# Patient Record
Sex: Female | Born: 1940
Health system: Southern US, Community
[De-identification: ages and names within clinical notes are randomized; demographics above are authoritative.]

## PROBLEM LIST (undated history)

## (undated) DIAGNOSIS — I10 Essential (primary) hypertension: Secondary | ICD-10-CM

## (undated) DIAGNOSIS — M1990 Unspecified osteoarthritis, unspecified site: Secondary | ICD-10-CM

## (undated) DIAGNOSIS — J449 Chronic obstructive pulmonary disease, unspecified: Secondary | ICD-10-CM

## (undated) DIAGNOSIS — Z8673 Personal history of transient ischemic attack (TIA), and cerebral infarction without residual deficits: Secondary | ICD-10-CM

## (undated) DIAGNOSIS — F17200 Nicotine dependence, unspecified, uncomplicated: Secondary | ICD-10-CM

## (undated) DIAGNOSIS — I429 Cardiomyopathy, unspecified: Secondary | ICD-10-CM

## (undated) DIAGNOSIS — I639 Cerebral infarction, unspecified: Secondary | ICD-10-CM

## (undated) HISTORY — PX: ABDOMINAL HYSTERECTOMY: SHX81

## (undated) HISTORY — DX: Personal history of transient ischemic attack (TIA), and cerebral infarction without residual deficits: Z86.73

## (undated) HISTORY — DX: Essential (primary) hypertension: I10

## (undated) HISTORY — PX: KNEE SURGERY: SHX244

## (undated) HISTORY — DX: Cardiomyopathy, unspecified: I42.9

## (undated) HISTORY — DX: Nicotine dependence, unspecified, uncomplicated: F17.200

## (undated) HISTORY — PX: LAMINECTOMY: SHX219

## (undated) HISTORY — PX: TONSILLECTOMY: SUR1361

## (undated) HISTORY — PX: CHOLECYSTECTOMY: SHX55

## (undated) HISTORY — DX: Chronic obstructive pulmonary disease, unspecified: J44.9

---

## 2007-03-28 ENCOUNTER — Inpatient Hospital Stay (HOSPITAL_COMMUNITY): Admission: EM | Admit: 2007-03-28 | Discharge: 2007-03-30 | Payer: Self-pay | Admitting: Emergency Medicine

## 2007-04-16 ENCOUNTER — Ambulatory Visit: Payer: Self-pay | Admitting: Cardiology

## 2007-08-27 ENCOUNTER — Encounter: Admission: RE | Admit: 2007-08-27 | Discharge: 2007-08-27 | Payer: Self-pay | Admitting: Occupational Medicine

## 2007-10-20 ENCOUNTER — Ambulatory Visit: Payer: Self-pay | Admitting: Family Medicine

## 2007-10-30 ENCOUNTER — Ambulatory Visit: Payer: Self-pay | Admitting: Family Medicine

## 2007-11-24 ENCOUNTER — Ambulatory Visit: Payer: Self-pay | Admitting: Family Medicine

## 2007-11-25 ENCOUNTER — Encounter: Admission: RE | Admit: 2007-11-25 | Discharge: 2007-11-25 | Payer: Self-pay | Admitting: Family Medicine

## 2007-12-25 ENCOUNTER — Ambulatory Visit: Payer: Self-pay | Admitting: Family Medicine

## 2008-03-24 ENCOUNTER — Ambulatory Visit: Payer: Self-pay | Admitting: Family Medicine

## 2008-05-14 ENCOUNTER — Ambulatory Visit: Payer: Self-pay | Admitting: Family Medicine

## 2009-09-09 ENCOUNTER — Ambulatory Visit: Payer: Self-pay | Admitting: Family Medicine

## 2009-10-17 ENCOUNTER — Ambulatory Visit: Payer: Self-pay | Admitting: Family Medicine

## 2010-01-11 ENCOUNTER — Ambulatory Visit: Payer: Self-pay | Admitting: Family Medicine

## 2010-05-10 ENCOUNTER — Ambulatory Visit: Payer: Self-pay | Admitting: Family Medicine

## 2010-05-18 ENCOUNTER — Emergency Department (HOSPITAL_COMMUNITY): Admission: EM | Admit: 2010-05-18 | Discharge: 2010-05-18 | Payer: Self-pay | Admitting: Family Medicine

## 2010-06-07 ENCOUNTER — Ambulatory Visit: Payer: Self-pay | Admitting: Family Medicine

## 2010-06-08 ENCOUNTER — Ambulatory Visit: Payer: Self-pay | Admitting: Family Medicine

## 2010-07-27 ENCOUNTER — Ambulatory Visit: Payer: Self-pay | Admitting: Family Medicine

## 2010-08-15 ENCOUNTER — Ambulatory Visit: Payer: Self-pay | Admitting: Family Medicine

## 2010-08-30 ENCOUNTER — Ambulatory Visit: Payer: Self-pay | Admitting: Family Medicine

## 2010-09-06 ENCOUNTER — Ambulatory Visit: Payer: Self-pay | Admitting: Family Medicine

## 2010-10-03 ENCOUNTER — Ambulatory Visit: Payer: Self-pay | Admitting: Family Medicine

## 2010-10-06 ENCOUNTER — Encounter: Admission: RE | Admit: 2010-10-06 | Discharge: 2010-10-06 | Payer: Self-pay | Admitting: Family Medicine

## 2010-10-23 ENCOUNTER — Ambulatory Visit: Payer: Self-pay | Admitting: Family Medicine

## 2010-12-11 ENCOUNTER — Ambulatory Visit
Admission: RE | Admit: 2010-12-11 | Discharge: 2010-12-11 | Payer: Self-pay | Source: Home / Self Care | Attending: Family Medicine | Admitting: Family Medicine

## 2011-01-05 ENCOUNTER — Ambulatory Visit (INDEPENDENT_AMBULATORY_CARE_PROVIDER_SITE_OTHER): Payer: 59 | Admitting: Family Medicine

## 2011-01-05 ENCOUNTER — Ambulatory Visit: Payer: Self-pay | Admitting: Family Medicine

## 2011-01-05 DIAGNOSIS — M719 Bursopathy, unspecified: Secondary | ICD-10-CM

## 2011-02-04 LAB — SYNOVIAL CELL COUNT + DIFF, W/ CRYSTALS
Crystals, Fluid: NONE SEEN
Lymphocytes-Synovial Fld: 1 % (ref 0–20)
Neutrophil, Synovial: 85 % — ABNORMAL HIGH (ref 0–25)

## 2011-02-04 LAB — BODY FLUID CULTURE: Culture: NO GROWTH
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
## 2011-05-18 ENCOUNTER — Telehealth: Payer: Self-pay | Admitting: *Deleted

## 2011-05-18 ENCOUNTER — Ambulatory Visit (INDEPENDENT_AMBULATORY_CARE_PROVIDER_SITE_OTHER): Payer: 59 | Admitting: Medical

## 2011-05-18 ENCOUNTER — Encounter: Payer: Self-pay | Admitting: Medical

## 2011-05-18 ENCOUNTER — Ambulatory Visit
Admission: RE | Admit: 2011-05-18 | Discharge: 2011-05-18 | Disposition: A | Discharge status: Home / Self Care | Payer: 59 | Source: Ambulatory Visit | Attending: Medical | Admitting: Medical

## 2011-05-18 VITALS — BP 170/80 | HR 88 | Temp 98.4°F | Ht 66.5 in | Wt 155.0 lb

## 2011-05-18 DIAGNOSIS — M25559 Pain in unspecified hip: Secondary | ICD-10-CM

## 2011-05-18 DIAGNOSIS — M25551 Pain in right hip: Secondary | ICD-10-CM

## 2011-05-18 DIAGNOSIS — I10 Essential (primary) hypertension: Secondary | ICD-10-CM

## 2011-05-18 NOTE — Patient Instructions (Signed)
You can use ice or heat alternatively once to twice daily.  Use 2 Aleve OTC once to twice daily for pain.  Rest.  Go for xray today.

## 2011-05-18 NOTE — Telephone Encounter (Addendum)
Message copied by Dorthula Perfect on Fri May 18, 2011  4:03 PM ------      Message from: Aleen Campi, Kermit Balo      Created: Fri May 18, 2011  2:03 PM       Xray of hip shows some arthritis of the inside lower part of the hip joint.  Otherwise normal.  Lets have her take 1-2 Aleve once or twice daily, rest the hip by not being on it as much the next 3-5 days, and can use ice when flared up.   As we mentioned, I think it would be good to do some pool therapy or other PT. See if she is interested in seeing physical therapy for this?  If not, I would recommend she go to a local [facility] for water aerobics.  Pt notified of xray results.  Will go to the [facility] for water aerobics.  CM, LPN

## 2011-05-18 NOTE — Progress Notes (Signed)
  Subjective:    Gabriela Davis is a 70 y.o. female who presents with right hip pain. Onset of the symptoms was several weeks ago. Inciting event: none. The patient reports the hip pain is worse with weight bearing, is aggravated by walking, is worse after period of inactivity and is better with activity. Patient has had no prior hip problems. Previous visits for this problem: none. Evaluation to date: none. Treatment to date: ice and heat only.  She denies trauma or injury.  The following portions of the patient's history were reviewed and updated as appropriate: allergies, current medications, past family history, past medical history, past social history, past surgical history and problem list.   Past Medical History  Diagnosis Date  . Hypertension   . COPD (chronic obstructive pulmonary disease)   . Dyslipidemia   . History of CVA (cerebrovascular accident)   . Tobacco use disorder     Review of Systems Gen: no fever, chills, but +intentional wt loss, no falls Skin: no redness, bruising Abdomen: no pain, N/V/D/change in stool, blood GU: no dysuria, hematuria, urgency, change in stream Neuro: no numbness, tingling, weakness  Objective:   Filed Vitals:   05/18/11 0951  BP: 170/80  Pulse: 88  Temp: 98.4 F (36.9 C)   Gen: wd/wn, white female, and Skin: warm, dry, no ecchymosis or erythema Abdomen: non tender, no masses Back: mild right paraspinal low lumbar tenderness, ROM normal, no midline tenderness, there is a lumbar surgical scar from prior discectomy MSK: tender over right hip, tender over right trochanteric bursa, pain with hip ROM, worse with internal ROM, bilat internal ROM mildly reduced, otherwise knees and LE nontender, normal ROM, no obvious deformity Pulses: 2+ LE, no edema Neuro: normal leg strength and sensation   Assessment:   Encounter Diagnoses  Name Primary?  . Hip pain, right Yes  . Essential hypertension, benign       Plan:   She is tender  over right hip, pain with hip ROM, worse with internal ROM, and tender over bursa.  I suspect arthritis and bursitis.  Will send for xray.  For now, advised ice/heat alternating, 2 Aleve OTC once to BID, relative rest.    HTN - elevated today.  Recheck nurse visit BP check 2wk.

## 2011-05-21 ENCOUNTER — Encounter: Payer: Self-pay | Admitting: Family Medicine

## 2011-05-25 ENCOUNTER — Encounter: Payer: Self-pay | Admitting: Family Medicine

## 2011-05-25 ENCOUNTER — Ambulatory Visit (INDEPENDENT_AMBULATORY_CARE_PROVIDER_SITE_OTHER): Payer: 59 | Admitting: Family Medicine

## 2011-05-25 VITALS — BP 120/84 | HR 80 | Ht 65.5 in | Wt 155.0 lb

## 2011-05-25 DIAGNOSIS — S80869A Insect bite (nonvenomous), unspecified lower leg, initial encounter: Secondary | ICD-10-CM

## 2011-05-25 DIAGNOSIS — W57XXXA Bitten or stung by nonvenomous insect and other nonvenomous arthropods, initial encounter: Secondary | ICD-10-CM

## 2011-05-25 NOTE — Patient Instructions (Addendum)
Treat local reaction with ongoing ice, antihistamines (continue Zyrtec, may use dose of Benadryl at bedtime),  May use topical hydrocortisone or antihistamine to help with the itching. Keep leg elevated (above the level of heart) to help decrease the swelling. Follow up immediately if develops fever, red streaks, significantly worsening swelling, or other concerns  Keep up the good work in cutting back on your smoking

## 2011-05-25 NOTE — Progress Notes (Signed)
Patient presents for evaluation of her right ankle.  She was stung by a yellow jacket yesterday morning.  She thinks she got the stinger out.  Complaining of it being very itchy, and increasing swelling.  Today it is throbbing, and still itchy.  Has iced it, used calamine lotion, and is taking Zyrtec  Past Medical History  Diagnosis Date  . Hypertension   . COPD (chronic obstructive pulmonary disease)   . Dyslipidemia   . History of CVA (cerebrovascular accident)   . Tobacco use disorder    Current Outpatient Prescriptions on File Prior to Visit  Medication Sig Dispense Refill  . olmesartan-hydrochlorothiazide (BENICAR HCT) 20-12.5 MG per tablet Take 1 tablet by mouth daily.         Allergies  Allergen Reactions  . Keflex Anaphylaxis  . Morphine And Related Nausea And Vomiting  . Penicillins Swelling    SEVERE   ROS: no fevers, nausea/vomiting/diarrhea, URI complaints, shortness of breath or other problems  PHYSICAL EXAM: BP 120/84  Pulse 80  Ht 5' 5.5" (1.664 m)  Wt 155 lb (70.308 kg)  BMI 25.40 kg/m2 Well developed, pleasant female, in no distress Medial R calf (lower third) there is a 5.5 x 5.5 cm slightly pink area with a few excoriations/scabs noted (the actual sting area, per pt, is off-center from this circle, admits to scratching and causing some of the other excoriations).  Area feels warm.  No drainage.  No streaks.  1+ edema at ankle. 2+ distal pulses.  ASSESSMENT/PLAN: 1. Insect bite of leg    Insect bite with local reaction.  No evidence of cellulitis.  Treat local reaction with ongoing ice, antihistamines (continue Zyrtec, may use dose of Benadryl at bedtime),  May use topical hydrocortisone or antihistamine to help with the itching. Keep leg elevated (above the level of heart) to help decrease the swelling. Follow up immediately if develops fever, red streaks, significantly worsening swelling, or other concerns   Patient had questions regarding shingles vaccine.   Discussed risks and benefits.  Rx was written and given to patient so she can go to [employer] (or other) vs check with her insurance

## 2011-06-04 ENCOUNTER — Ambulatory Visit (INDEPENDENT_AMBULATORY_CARE_PROVIDER_SITE_OTHER): Payer: 59 | Admitting: Family Medicine

## 2011-06-04 ENCOUNTER — Encounter: Payer: Self-pay | Admitting: Family Medicine

## 2011-06-04 VITALS — BP 154/80 | HR 72 | Ht 65.5 in | Wt 156.0 lb

## 2011-06-04 DIAGNOSIS — S80819A Abrasion, unspecified lower leg, initial encounter: Secondary | ICD-10-CM

## 2011-06-04 DIAGNOSIS — IMO0002 Reserved for concepts with insufficient information to code with codable children: Secondary | ICD-10-CM

## 2011-06-04 DIAGNOSIS — I10 Essential (primary) hypertension: Secondary | ICD-10-CM | POA: Insufficient documentation

## 2011-06-04 NOTE — Patient Instructions (Signed)
Continue to monitor BP at home--if persistently greater than 135/85 then f/u with Dr. Susann Givens.  Low sodium diet, regular exercise  Use topical antibacterial ointment 2-3 times daily until skin is healed.  Follow up immediately if you develop increased redness, warmth, swelling, red streaks up the leg, or fevers
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
## 2011-06-12 ENCOUNTER — Other Ambulatory Visit: Payer: Self-pay | Admitting: Family Medicine

## 2011-07-17 ENCOUNTER — Ambulatory Visit (INDEPENDENT_AMBULATORY_CARE_PROVIDER_SITE_OTHER): Payer: 59 | Admitting: Family Medicine

## 2011-07-17 ENCOUNTER — Encounter: Payer: Self-pay | Admitting: Family Medicine

## 2011-07-17 VITALS — BP 140/88 | HR 88 | Wt 161.0 lb

## 2011-07-17 DIAGNOSIS — L259 Unspecified contact dermatitis, unspecified cause: Secondary | ICD-10-CM

## 2011-07-17 NOTE — Patient Instructions (Signed)
Use cortisone cream and Benadryl. If it gets worse call me

## 2011-07-17 NOTE — Progress Notes (Signed)
  Subjective:    Patient ID: Gabriela Davis, female    DOB: Jul 03, 1941, 70 y.o.   MRN: [id number]  HPI She is here for evaluation of a rash. His present in her underwear line as well as on her arms.   Review of Systems     Objective:   Physical Exam Alert and in no distress. Erythematous area noted in her underwear area as well as scattered lesions on her arms. These or not linear and not vesicular.       Assessment & Plan:  Contact dermatitis Recommend cortisone cream and Benadryl. Call if symptoms worsen .

## 2011-09-11 ENCOUNTER — Ambulatory Visit (INDEPENDENT_AMBULATORY_CARE_PROVIDER_SITE_OTHER): Payer: 59 | Admitting: Medical

## 2011-09-11 ENCOUNTER — Encounter: Payer: Self-pay | Admitting: Medical

## 2011-09-11 VITALS — BP 140/90 | HR 62 | Temp 98.4°F | Resp 16 | Ht 66.0 in | Wt 158.0 lb

## 2011-09-11 DIAGNOSIS — M25539 Pain in unspecified wrist: Secondary | ICD-10-CM

## 2011-09-11 DIAGNOSIS — M25542 Pain in joints of left hand: Secondary | ICD-10-CM

## 2011-09-11 DIAGNOSIS — I10 Essential (primary) hypertension: Secondary | ICD-10-CM

## 2011-09-11 DIAGNOSIS — M25549 Pain in joints of unspecified hand: Secondary | ICD-10-CM

## 2011-09-11 DIAGNOSIS — M1990 Unspecified osteoarthritis, unspecified site: Secondary | ICD-10-CM | POA: Insufficient documentation

## 2011-09-11 DIAGNOSIS — M25532 Pain in left wrist: Secondary | ICD-10-CM

## 2011-09-11 MED ORDER — NAPROXEN SODIUM 220 MG PO TABS: 220.0000 mg | ORAL_TABLET | Freq: Two times a day (BID) | ORAL | Status: DC

## 2011-09-11 NOTE — Progress Notes (Signed)
Subjective:   HPI  Gabriela Davis is a 70 y.o. female who presents for pain in left hand x 1 day.  She notes that yesterday she was fine, but awoke this morning with pain in her left hand around the wrist and base of thumb.  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  Denies similar pain.  Denies hx/o gout.  She does have hx/o osteoarthritis in the fingers.  This morning started getting pain and slight swelling in the left hand.  Ice seemed to worsen.  Movement worsens, but rest helps.  Using nothing else for the pain.  Denies redness, fever.  No other aggravating or relieving factors.  No other c/o.  She forgot to take her BP medication this morning.   The following portions of the patient's history were reviewed and updated as appropriate: allergies, current medications, past family history, past medical history, past social history, past surgical history and problem list.  Past Medical History  Diagnosis Date  . Hypertension   . COPD (chronic obstructive pulmonary disease)   . Dyslipidemia   . History of CVA (cerebrovascular accident)   . Tobacco use disorder    Allergies  Allergen Reactions  . Keflex Anaphylaxis  . Morphine And Related Nausea And Vomiting  . Penicillins Swelling    SEVERE    Current Outpatient Prescriptions on File Prior to Visit  Medication Sig Dispense Refill  . cetirizine (ZYRTEC) 10 MG tablet Take 10 mg by mouth as needed.        Marland Kitchen lisinopril-hydrochlorothiazide (PRINZIDE,ZESTORETIC) 20-12.5 MG per tablet TAKE ONE TABLET BY MOUTH EVERY DAY  30 tablet  prn   Review of Systems Constitutional: -fever, -chills, -sweats, -unexpected -weight change,-fatigue ENT: -runny nose, -ear pain, -sore throat Cardiology:  -chest pain, -palpitations, -edema Respiratory: -cough, -shortness of breath, -wheezing Gastroenterology: -abdominal pain, -nausea,  -vomiting, -diarrhea, -constipation Hematology: -bleeding or bruising problems Musculoskeletal: -arthralgias, -myalgias, -joint swelling, -back pain Ophthalmology: -vision changes Urology: -dysuria, -difficulty urinating, -hematuria, -urinary frequency, -urgency Neurology: -headache, -weakness, -tingling, -numbness     Objective:   Physical Exam  Filed Vitals:   09/11/11 0949  BP: 140/90  Pulse: 62  Temp: 98.4 F (36.9 C)  Resp: 16    General appearance: alert, no distress, WD/WN, white female Skin: no warmth, erythema MSK: tender over left wrist medially, tender over base of 1st and 2nd metacarpals, left wrist tender with wrist extension, less tenderness with eversion and inversion, but wrist flexion nontender, no obvious swelling, no warmth or erythema.  Several fingers at DIPs with bony arthritic changes. Otherwise bilat UE exam unremarkable. Neuro: normal strength and sensation of bilat UE Pulses: 2+ symmetric, upper and lower extremities, normal cap refill   Assessment and Plan :    Encounter Diagnoses  Name Primary?  . Wrist pain, left Yes  . Pain, joint, hand, left   . Osteoarthritis   . Essential hypertension, benign    Wrist and hand pain - advised Aleve OTC for 3-5 days prn, consider either ACE wrap for compression or OTC wrist/thumb splint for the next 3-5 days prn for comfort and immobilization.  Advised relative rest.  As symptoms improve, then use splint prn only.   recheck in 1-2 wk.   Osteoarthritis - hx/o OA in fingers, advised regular exercise, OTC Tylenol or Aleve prn use.    HTN - advised she make sure she  takes her medication daily.    Follow-up  1-2 wk.

## 2011-09-26 ENCOUNTER — Ambulatory Visit (INDEPENDENT_AMBULATORY_CARE_PROVIDER_SITE_OTHER): Payer: 59 | Admitting: Family Medicine

## 2011-09-26 ENCOUNTER — Emergency Department (HOSPITAL_COMMUNITY): Payer: No Typology Code available for payment source

## 2011-09-26 ENCOUNTER — Encounter (HOSPITAL_COMMUNITY): Payer: Self-pay

## 2011-09-26 ENCOUNTER — Encounter: Payer: Self-pay | Admitting: Family Medicine

## 2011-09-26 ENCOUNTER — Emergency Department (HOSPITAL_COMMUNITY)
Admission: EM | Admit: 2011-09-26 | Discharge: 2011-09-26 | Disposition: A | Discharge status: Home / Self Care | Payer: No Typology Code available for payment source | Attending: Emergency Medicine | Admitting: Emergency Medicine

## 2011-09-26 DIAGNOSIS — Z8673 Personal history of transient ischemic attack (TIA), and cerebral infarction without residual deficits: Secondary | ICD-10-CM | POA: Insufficient documentation

## 2011-09-26 DIAGNOSIS — S0083XA Contusion of other part of head, initial encounter: Secondary | ICD-10-CM

## 2011-09-26 DIAGNOSIS — F17200 Nicotine dependence, unspecified, uncomplicated: Secondary | ICD-10-CM | POA: Insufficient documentation

## 2011-09-26 DIAGNOSIS — I10 Essential (primary) hypertension: Secondary | ICD-10-CM | POA: Insufficient documentation

## 2011-09-26 DIAGNOSIS — R42 Dizziness and giddiness: Secondary | ICD-10-CM | POA: Insufficient documentation

## 2011-09-26 DIAGNOSIS — R51 Headache: Secondary | ICD-10-CM

## 2011-09-26 DIAGNOSIS — F29 Unspecified psychosis not due to a substance or known physiological condition: Secondary | ICD-10-CM | POA: Insufficient documentation

## 2011-09-26 DIAGNOSIS — J4489 Other specified chronic obstructive pulmonary disease: Secondary | ICD-10-CM | POA: Insufficient documentation

## 2011-09-26 DIAGNOSIS — R220 Localized swelling, mass and lump, head: Secondary | ICD-10-CM | POA: Insufficient documentation

## 2011-09-26 DIAGNOSIS — Z9889 Other specified postprocedural states: Secondary | ICD-10-CM | POA: Insufficient documentation

## 2011-09-26 DIAGNOSIS — E785 Hyperlipidemia, unspecified: Secondary | ICD-10-CM | POA: Insufficient documentation

## 2011-09-26 DIAGNOSIS — J449 Chronic obstructive pulmonary disease, unspecified: Secondary | ICD-10-CM | POA: Insufficient documentation

## 2011-09-26 DIAGNOSIS — M542 Cervicalgia: Secondary | ICD-10-CM | POA: Insufficient documentation

## 2011-09-26 DIAGNOSIS — S0510XA Contusion of eyeball and orbital tissues, unspecified eye, initial encounter: Secondary | ICD-10-CM | POA: Insufficient documentation

## 2011-09-26 DIAGNOSIS — S0003XA Contusion of scalp, initial encounter: Secondary | ICD-10-CM | POA: Insufficient documentation

## 2011-09-26 DIAGNOSIS — Z79899 Other long term (current) drug therapy: Secondary | ICD-10-CM | POA: Insufficient documentation

## 2011-09-26 NOTE — Patient Instructions (Signed)
Please go to [facility] Emergency room for evaluation.  I'm very concerned about your blood pressure and mental status changes--you will likely need CT scan, x-rays, BP monitoring, and labs  Please continue to cut back on your smoking and QUIT.
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
## 2011-09-27 NOTE — ED Provider Notes (Signed)
Medical screening examination/treatment/procedure(s) were performed by non-physician practitioner and as supervising physician I was immediately available for consultation/collaboration.   Leigh-Ann Aymee Fomby, MD 09/27/11 0721 

## 2011-11-01 ENCOUNTER — Ambulatory Visit (INDEPENDENT_AMBULATORY_CARE_PROVIDER_SITE_OTHER): Payer: 59 | Admitting: Family Medicine

## 2011-11-01 ENCOUNTER — Encounter: Payer: Self-pay | Admitting: Family Medicine

## 2011-11-01 VITALS — BP 160/90 | HR 87 | Wt 155.0 lb

## 2011-11-01 DIAGNOSIS — G44309 Post-traumatic headache, unspecified, not intractable: Secondary | ICD-10-CM

## 2011-11-01 DIAGNOSIS — M79609 Pain in unspecified limb: Secondary | ICD-10-CM

## 2011-11-01 DIAGNOSIS — M79673 Pain in unspecified foot: Secondary | ICD-10-CM

## 2011-11-01 NOTE — Patient Instructions (Signed)
Use heat for 20 minutes 3 times per day. Take Tylenol 2 pills 4 times per day for the next week or 2 and see what that'll do to help
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
## 2011-12-25 DIAGNOSIS — Z029 Encounter for administrative examinations, unspecified: Secondary | ICD-10-CM

## 2012-01-14 ENCOUNTER — Ambulatory Visit (INDEPENDENT_AMBULATORY_CARE_PROVIDER_SITE_OTHER): Payer: Medicare HMO | Admitting: Family Medicine

## 2012-01-14 ENCOUNTER — Encounter: Payer: Self-pay | Admitting: Family Medicine

## 2012-01-14 VITALS — BP 140/90 | HR 91

## 2012-01-14 DIAGNOSIS — H0019 Chalazion unspecified eye, unspecified eyelid: Secondary | ICD-10-CM

## 2012-01-14 DIAGNOSIS — H0013 Chalazion right eye, unspecified eyelid: Secondary | ICD-10-CM

## 2012-01-14 NOTE — Progress Notes (Signed)
  Subjective:    Patient ID: Gabriela Davis, female    DOB: [birth year], 71 y.o.   MRN: [id number]  HPI She complains of a lesion in the right lateral lower eyelid. She has been using warm soaks on it for the last week and has not diminished. She is having no pain, blurred or double vision.   Review of Systems     Objective:   Physical Exam Small cystic lesion approximately 0.5 cm noted in the left lateral lower eyelid.      Assessment & Plan:   1. Chalazion of right eye  Ambulatory referral to Ophthalmology

## 2012-01-14 NOTE — Patient Instructions (Signed)
We will refer you to an ophthalmologist. Return here as needed

## 2012-03-25 ENCOUNTER — Ambulatory Visit (INDEPENDENT_AMBULATORY_CARE_PROVIDER_SITE_OTHER): Payer: Medicare HMO | Admitting: Medical

## 2012-03-25 ENCOUNTER — Encounter: Payer: Self-pay | Admitting: Medical

## 2012-03-25 DIAGNOSIS — S335XXA Sprain of ligaments of lumbar spine, initial encounter: Secondary | ICD-10-CM

## 2012-03-25 MED ORDER — ACETAMINOPHEN-CODEINE #3 300-30 MG PO TABS: 1.0000 | ORAL_TABLET | ORAL | Status: AC | PRN

## 2012-03-25 MED ORDER — CYCLOBENZAPRINE HCL 5 MG PO TABS: 5.0000 mg | ORAL_TABLET | Freq: Three times a day (TID) | ORAL | Status: AC | PRN

## 2012-03-25 NOTE — Patient Instructions (Signed)
You have a back strain  Recommendations:  Use OTC Aleve twice daily for the next 3-5 days  No lifting for 3-5 days, then start with gradual light weight lifting if needed  Rest, and you can use some heat pad on the area  You can use your chair massager  Flexeril can be used at bedtime or up to 3 times daily for spasm but this can cause drowsiness  Tylenol #3 for pain as needed  After 4-5 days if much improved, start doing some gentle stretching and gentle range of motion exercise.

## 2012-03-25 NOTE — Progress Notes (Signed)
Subjective: Here for possible pulled muscle in right hip.  She notes that she moved to a new place of residence last week, been packing up lots of her belonging for the move.  Other people lifted the heavier stuff, but she moved a lot of things herself.  Thinks she pulled a muscle.  Pain is 4 inches above and below hips.  Hurts to sit, lie, and worse with rising from sitting position to standing.   When standing has to wait a minute before she can move.  Last night had to prop leg up on a pillow.  She denies trauma or fall. She has hx/o low back pain and prior back surgery L4-5.  Using Aleve and topical ice and heat.   Past Medical History  Diagnosis Date  . Hypertension   . COPD (chronic obstructive pulmonary disease)   . Dyslipidemia   . History of CVA (cerebrovascular accident)   . Tobacco use disorder    Past Surgical History  Procedure Date  . Laminectomy     L4-5  . Cholecystectomy   . Knee surgery     x 2-3, L knee  . Abdominal hysterectomy     Review of Systems Constitutional: denies fever, chills, sweats, unexpected weight change, anorexia, fatigue Dermatology: rash, bruising, redness Cardiology: denies chest pain, palpitations, edema Respiratory: denies cough, shortness of breath Gastroenterology: denies abdominal pain, nausea, vomiting, diarrhea, constipation, blood in stool, changes in bowel movement, dysphagia Urology: denies dysuria, difficulty urinating, hematuria Neurology: no headache, weakness, tingling, numbness      Objective:    Filed Vitals:   03/25/12 1110  BP: 150/90  Pulse: 92  Temp: 98.1 F (36.7 C)  Resp: 16    General appearance: alert, no distress, WD/WN, female Neck: supple, normal ROM Abdomen: +bs, soft, non tender, non distended, no masses, no hepatomegaly, no splenomegaly, no bruits MSK: hips nontender, normal hip ROM Neuro: normal LE strength, DTRs normal, -SLR Back: tender along bilat paraspinal lumbar muscles, worse on right +spasm,  ROM reduced due to spasm and LBP, no deformity, no midline tenderness Extremities: no edema Pulses: 2+ symmetric     Assessment:    Encounter Diagnosis  Name Primary?  . Low back strain Yes      Plan:    Advised relative rest, no lifting x 1wk, then gradually resume normal activity with light lifting at first.   As pain resolves can use gentle ROM and stretching.  Scripts for Flexeril and Tylenol #3 for pain prn, can also use Aleve BID OTC for the next few days.   Call/return if worse or not improving.

## 2012-04-08 ENCOUNTER — Ambulatory Visit (INDEPENDENT_AMBULATORY_CARE_PROVIDER_SITE_OTHER): Payer: Medicare HMO | Admitting: Medical

## 2012-04-08 ENCOUNTER — Encounter: Payer: Self-pay | Admitting: Medical

## 2012-04-08 VITALS — BP 150/80 | HR 102 | Temp 98.1°F | Resp 16 | Wt 162.0 lb

## 2012-04-08 DIAGNOSIS — H6690 Otitis media, unspecified, unspecified ear: Secondary | ICD-10-CM

## 2012-04-08 DIAGNOSIS — J40 Bronchitis, not specified as acute or chronic: Secondary | ICD-10-CM

## 2012-04-08 DIAGNOSIS — R062 Wheezing: Secondary | ICD-10-CM

## 2012-04-08 MED ORDER — ALBUTEROL SULFATE HFA 108 (90 BASE) MCG/ACT IN AERS: 2.0000 | INHALATION_SPRAY | Freq: Four times a day (QID) | RESPIRATORY_TRACT | Status: DC | PRN

## 2012-04-08 MED ORDER — CLARITHROMYCIN 500 MG PO TABS: 500.0000 mg | ORAL_TABLET | Freq: Two times a day (BID) | ORAL | Status: AC

## 2012-04-08 NOTE — Progress Notes (Signed)
Subjective:  Gabriela Davis is a 71 y.o. female who presents for illness.  Started having pain in back yesterday, but last night awoke with ear pain on the left, sweats, and cough hurting through to her back.  She slept all day Sunday 3 days ago, feeling fatigued, and had chills and sweats.  Feels achy all over, feels short of breath today.  Last time she was like this was few years ago.  She does not use inhalers in general.  Denies fever, sore throat, but cough is productive, has sinus fulness.   She says she has been "told she has COPD" in the past.  Smokes 5-6 cigarettes daily.  Denies sick contacts.  No other aggravating or relieving factors.  No other c/o.  The following portions of the patient's history were reviewed and updated as appropriate: allergies, current medications, past family history, past medical history, past social history, past surgical history and problem list.  Past Medical History  Diagnosis Date  . Hypertension   . COPD (chronic obstructive pulmonary disease)   . Dyslipidemia   . History of CVA (cerebrovascular accident)   . Tobacco use disorder     Objective:   Filed Vitals:   04/08/12 0946  BP: 150/80  Pulse: 102  Temp: 98.1 F (36.7 C)  Resp: 16    General appearance: Alert, WD/WN, no distress, ill appearing                             Skin: warm, no rash, no diaphoresis                           Head: no sinus tenderness                            Eyes: conjunctiva normal, corneas clear, PERRLA                            Ears: right TM retracted and erythema, left TM with erythema, external ear canals normal                          Nose: septum midline, turbinates swollen, with erythema and clear discharge             Mouth/throat: MMM, tongue normal, mild pharyngeal erythema                           Neck: supple, no adenopathy, no thyromegaly, nontender                          Heart: RRR, normal S1, S2, no murmurs                         Lungs:  +bronchial breath sounds, +scattered rhonchi, +expiratory wheezes, no rales                Extremities: no edema, nontender     Assessment and Plan:   Encounter Diagnoses  Name Primary?  . Bronchitis Yes  . Otitis media   . Wheezing     Prescription given today for Biaxin and Albuterol as below.  Discussed diagnosis and treatment of bronchitis and OM.  Suggested symptomatic OTC remedies  for cough and congestion.  Tylenol or Ibuprofen OTC for fever and malaise.  Recommended she stop smoking.  Call/return in 2-3 days if symptoms are worse or not improving.

## 2012-04-08 NOTE — Patient Instructions (Signed)
Consider Mucinex DM or Coricidin HBP OTC for cough/congestion  Drink plenty of water, REST.  Begin inhaler 3 times daily for the next few days, then back off to every 6 hours as needed  Begin Biaxin antibiotic, twice daily for 10 days.  If not improving in the next 3-4 days, call or return, particularly if having difficulty breathing   Acute Bronchitis Bronchitis is a problem of the air tubes leading to your lungs. Acute means the illness started quickly. In this condition, the lining of those tubes becomes puffy (swollen) and can leak fluid. This makes it harder for air to get in and out of your lungs. You may cough a lot. This is because the air tubes are narrow. Bronchitis is most often caused by a virus. Medicines that kill germs (antibiotics) may be needed with germ (bacteria) infections for people who:  Smoke.   Have lasting (chronic) lung problems.   Are elderly.  HOME CARE  Rest.   Drink enough water and fluids to keep the pee clear or pale yellow.   Only take medicine as told by your doctor.   Medicines may be prescribed that will open up the airways. This will help make breathing easier.   Bronchitis usually gets better on its own in a few days.  Recovery from some problems (symptoms) of bronchitis may be slow. You should start feeling a little better after 2 to 3 days. Coughing may last for 3 to 4 weeks. GET HELP RIGHT AWAY IF:  You or your child has a temperature by mouth above 101, not controlled by medicine.   Chills or chest pain develops.   You or your child develops very bad shortness of breath.   There is bloody saliva mixed with mucus (sputum).   You or your child throws up (vomits) often, loses too much fluid (dehydration), feels faint, or has a very bad headache.   You or your child does not improve after 1 week of treatment.  MAKE SURE YOU:   Understand these instructions.   Will watch this condition.   Will get help right away if you or your  child is not doing well or gets worse.  Document Released: 04/23/2008 Document Re-Released: 01/30/2010 [facility] Patient Information 2011 [place], [place].   Otitis Media, Adult A middle ear infection is an infection in the space behind the eardrum. The medical name for this is "otitis media." It may happen after a common cold. It is caused by a germ that starts growing in that space. You may feel swollen glands in your neck on the side of the ear infection. HOME CARE INSTRUCTIONS   Take your medicine as directed until it is gone, even if you feel better after the first few days.   Only take over-the-counter or prescription medicines for pain, discomfort, or fever as directed by your caregiver.   Occasional use of a nasal decongestant a couple times per day may help with discomfort and help the eustachian tube to drain better.  Follow up with your caregiver in 10 to 14 days or as directed, to be certain that the infection has cleared. Not keeping the appointment could result in a chronic or permanent injury, pain, hearing loss and disability. If there is any problem keeping the appointment, you must call back to this facility for assistance. SEEK IMMEDIATE MEDICAL CARE IF:   You are not getting better in 2 to 3 days.   You have pain that is not controlled with medication.  You feel worse instead of better.   You cannot use the medication as directed.   You develop swelling, redness or pain around the ear or stiffness in your neck.  MAKE SURE YOU:   Understand these instructions.   Will watch your condition.   Will get help right away if you are not doing well or get worse.  Document Released: 08/10/2004 Document Revised: 10/25/2011 Document Reviewed: 06/11/2008 [facility] Patient Information 2012 [place], [place].

## 2012-04-15 ENCOUNTER — Telehealth: Payer: Self-pay | Admitting: Family Medicine

## 2012-04-15 MED ORDER — LEVOFLOXACIN 500 MG PO TABS: 500.0000 mg | ORAL_TABLET | Freq: Every day | ORAL | Status: DC

## 2012-04-15 NOTE — Telephone Encounter (Signed)
Apparently no improvement on Biaxin. I will switch to Levaquin

## 2012-04-18 ENCOUNTER — Encounter: Payer: Self-pay | Admitting: Family Medicine

## 2012-04-18 ENCOUNTER — Ambulatory Visit (INDEPENDENT_AMBULATORY_CARE_PROVIDER_SITE_OTHER): Payer: Medicare HMO | Admitting: Family Medicine

## 2012-04-18 VITALS — BP 160/100 | HR 114 | Wt 165.0 lb

## 2012-04-18 DIAGNOSIS — H6690 Otitis media, unspecified, unspecified ear: Secondary | ICD-10-CM

## 2012-04-18 DIAGNOSIS — H6693 Otitis media, unspecified, bilateral: Secondary | ICD-10-CM

## 2012-04-18 NOTE — Progress Notes (Signed)
  Subjective:    Patient ID: Gabriela Davis, female    DOB: 04/01/1941, 71 y.o.   MRN: [id number]  HPI She is here for a recheck. She is doing better except for difficulty especially with her left ear and having a popping sensation. She does not complain sore throat, cough, congestion. His only she is on Levaquin.  Review of Systems     Objective:   Physical Exam Alert and in no distress. Both TMs are dull and vascular. Neck is supple without adenopathy. Throat is clear.      Assessment & Plan:   1. BOM (bilateral otitis media)    encouraged her to continue on the antibiotic and did recommend several day course of Afrin to see if this will help with the ear sensation.

## 2012-04-18 NOTE — Patient Instructions (Signed)
Use Afrin for the next few days

## 2012-04-22 ENCOUNTER — Encounter: Payer: Self-pay | Admitting: Medical

## 2012-04-22 ENCOUNTER — Ambulatory Visit (INDEPENDENT_AMBULATORY_CARE_PROVIDER_SITE_OTHER): Payer: Medicare HMO | Admitting: Medical

## 2012-04-22 VITALS — BP 140/90 | HR 94 | Temp 98.2°F | Resp 16 | Wt 164.0 lb

## 2012-04-22 DIAGNOSIS — M25512 Pain in left shoulder: Secondary | ICD-10-CM

## 2012-04-22 DIAGNOSIS — M25519 Pain in unspecified shoulder: Secondary | ICD-10-CM

## 2012-04-22 NOTE — Progress Notes (Addendum)
Subjective: Here for left shoulder pain.  She notes hx/o 2-3 years of left shoulder pain.  She notes hx/o right rotator cuff problems as well, but this time her left shoulder is hurting bad.  The left shoulder flares up every now and then.  She notes point tenderness over the joint.  Worse with pain over the head or to the side.  No other aggravating factors.  Pain can be 7-10/10.  Using Ibuprofen, 2 tablets in the morning, 2 in the evening.  She denies injury or trauma, no joint swelling.  She notes hx/o joint injections in both shoulders prior, wants another injection today. She is right handed.  Past Medical History  Diagnosis Date  . Hypertension   . COPD (chronic obstructive pulmonary disease)   . Dyslipidemia   . History of CVA (cerebrovascular accident)   . Tobacco use disorder    Objective: Gen: wd, wn, nad Skin: no erythema or ecchymosis MSK: point tenderness over left AC joint.  Pain with active ROM with shoulder flexion and abduction above 90 degrees.  Passive ROM relative normal.  No obvious laxity, negative special tests for neers, hawkins, sulcus, apprehension, empty can.  Rest of left UE exam unremarkable. Neuro: normal strength and sensation of UE bilat   Assessment: Encounter Diagnoses  Name Primary?  . AC joint pain, left Yes  . Shoulder pain, left     Plan: Discussed case with supervising physician who also examined patient.  We will send for left shoulder xray for likely arthropathy of left AC joint.   In the meantime can use Ibuprofen, 4 tablets OTC TID. Will likely need ortho referral. If needed can tolerate Tylenol #3 but nothing stronger.

## 2012-04-22 NOTE — Progress Notes (Signed)
Addended by: Jac Canavan on: 04/22/2012 02:51 PM   Modules accepted: Orders

## 2012-04-25 ENCOUNTER — Telehealth: Payer: Self-pay | Admitting: Medical

## 2012-04-25 ENCOUNTER — Other Ambulatory Visit: Payer: Self-pay

## 2012-04-25 DIAGNOSIS — M25519 Pain in unspecified shoulder: Secondary | ICD-10-CM

## 2012-04-25 MED ORDER — ACETAMINOPHEN-CODEINE #3 300-30 MG PO TABS: 1.0000 | ORAL_TABLET | ORAL | Status: DC | PRN

## 2012-04-25 NOTE — Telephone Encounter (Signed)
Pt called ibuprofen 4 X's a day (taking 2, 250mg  tablets) not helping with shoulder pain. Pt states she was told to call back for pain Rx if it was not helping. Pt states Tylenol # 3 w/ codeine is about the strongest pain medication she can take

## 2012-04-25 NOTE — Telephone Encounter (Signed)
Give her Tylenol #3. 30tabs. One or 2 every 4 hours when necessary pain. Make sure she is set up for an x-ray since I did not see the report.

## 2012-04-26 ENCOUNTER — Encounter (HOSPITAL_COMMUNITY): Payer: Self-pay

## 2012-04-26 ENCOUNTER — Emergency Department (INDEPENDENT_AMBULATORY_CARE_PROVIDER_SITE_OTHER)
Admission: EM | Admit: 2012-04-26 | Discharge: 2012-04-26 | Disposition: A | Discharge status: Home / Self Care | Payer: Medicare HMO | Source: Home / Self Care | Attending: Emergency Medicine | Admitting: Emergency Medicine

## 2012-04-26 DIAGNOSIS — M659 Synovitis and tenosynovitis, unspecified: Secondary | ICD-10-CM

## 2012-04-26 DIAGNOSIS — M7750 Other enthesopathy of unspecified foot: Secondary | ICD-10-CM

## 2012-04-26 NOTE — Discharge Instructions (Signed)
Followup with your primary care Dr. as discussed to consider ordering a vascular study. Use provided crutches and Cam Walker for 5-7 days (no longer) if pain persists in the same area in the back of your ankle need to see the orthopedic Dr. further treatment modalities your exam was consistent with Achilles tendinitis

## 2012-04-26 NOTE — ED Provider Notes (Signed)
History     CSN: [id number]  Arrival date & time 04/26/12  1104   First MD Initiated Contact with Patient 04/26/12 1123      Chief Complaint  Patient presents with  . Foot Pain    (Consider location/radiation/quality/duration/timing/severity/associated sxs/prior treatment) HPI Comments: Patient presents urgent care today with ongoing left ankle foot swelling painful it started Thursday. Patient denies any injuries or recent sprains are portions to her left ankle. She describes that her swelling has improved this morning that he was moderate yesterday as her swelling went up to her mid calf area. She denies any calf tenderness or pain when she walks exclusively in the posterior aspect of her left ankle. (See illustration). Patient denies any recent injury, after reviewing her recent telephone notes with her primary care Dr. patient described that she did not mention this to 2 them over the phone and that she has been taking Tylenol #3 for her shoulder problems. Patient is accompanied by her significant other as they describe the problem somewhat differently but he is concerned that she has had a stroke in the past and she continues to smoke and is asking if she is at risk for another stroke or heart problems, which I replied that yes she is at risk.  Patient denies any numbness, tingling sensation her distal weakness.  Patient is a 71 y.o. female presenting with lower extremity pain. The history is provided by the patient and the spouse.  Foot Pain This is a new problem. The current episode started more than 2 days ago. The problem occurs constantly. The problem has been gradually worsening. The symptoms are aggravated by walking and standing. The symptoms are relieved by acetaminophen (Tylenol#3). She has tried acetaminophen for the symptoms. The treatment provided mild relief.    Past Medical History  Diagnosis Date  . Hypertension   . COPD (chronic obstructive pulmonary disease)   .  Dyslipidemia   . History of CVA (cerebrovascular accident)   . Tobacco use disorder     Past Surgical History  Procedure Date  . Laminectomy     L4-5  . Cholecystectomy   . Knee surgery     x 2-3, L knee  . Abdominal hysterectomy     History reviewed. No pertinent family history.  History  Substance Use Topics  . Smoking status: Current Everyday Smoker -- 0.5 packs/day    Types: Cigarettes  . Smokeless tobacco: Never Used   Comment: cutting back, now smoking 6 cigarettes daily  . Alcohol Use: 1.0 oz/week    2 drink(s) per week     2-3 beers every 2 weeks    OB History    Grav Para Term Preterm Abortions TAB SAB Ect Mult Living                  Review of Systems  Constitutional: Negative for activity change and appetite change.  Skin: Positive for color change and pallor.  Neurological: Negative for weakness and numbness.    Allergies  Cephalexin; Morphine and related; and Penicillins  Home Medications   Current Outpatient Rx  Name Route Sig Dispense Refill  . ACETAMINOPHEN-CODEINE #3 300-30 MG PO TABS Oral Take 1 tablet by mouth every 4 (four) hours as needed. 30 tablet 0  . ALBUTEROL SULFATE HFA 108 (90 BASE) MCG/ACT IN AERS Inhalation Inhale 2 puffs into the lungs every 6 (six) hours as needed for wheezing. 1 Inhaler 0  . LISINOPRIL-HYDROCHLOROTHIAZIDE 20-12.5 MG PO TABS  TAKE ONE  TABLET BY MOUTH EVERY DAY 30 tablet prn    BP 158/75  Pulse 82  Temp(Src) 98.5 F (36.9 C) (Oral)  Resp 18  SpO2 95%  Physical Exam  Nursing note and vitals reviewed. Constitutional: She appears well-developed and well-nourished.  Non-toxic appearance. She does not have a sickly appearance. She does not appear ill. No distress.  Musculoskeletal: She exhibits tenderness. She exhibits no edema.       Feet:  Neurological: She is alert.  Skin: No erythema.    ED Course  Procedures (including critical care time)  Labs Reviewed - No data to display No results  found.   1. Tendinitis of ankle       MDM  Exam is suggestive and consistent with aquilles tendinitis without rupture. Have decided to recommend not to bear weight on her left ankle for the next 3-5 days. Have also recommended her to see her primary care Dr. early next next week she might be experiencing early signs of vascular deficiencies as expressed by her delayed capillary refill and distal pallor to both her feet. Patient is a heavy smoker as many risk factors for cardiovascular events and peripheral vascular disease. She understood treatment plan and agreed to followup care we also discuss what symptoms will warrant further evaluation emergently in the emergency department.      Jimmie Molly, MD 04/26/12 1534

## 2012-04-26 NOTE — ED Notes (Signed)
Pt has lt heel pain that started on Thursday with no know injury.  Lt ankle and foot swollen and painful.  The bottom of her foot is purple and when I asked to see bottom of the rt foot it is also purple.  Pt unaware.

## 2012-05-01 ENCOUNTER — Ambulatory Visit (INDEPENDENT_AMBULATORY_CARE_PROVIDER_SITE_OTHER): Payer: Medicare HMO | Admitting: Family Medicine

## 2012-05-01 ENCOUNTER — Encounter: Payer: Self-pay | Admitting: Family Medicine

## 2012-05-01 ENCOUNTER — Other Ambulatory Visit: Payer: Self-pay

## 2012-05-01 VITALS — BP 120/80 | HR 76 | Wt 164.0 lb

## 2012-05-01 DIAGNOSIS — M7662 Achilles tendinitis, left leg: Secondary | ICD-10-CM

## 2012-05-01 DIAGNOSIS — F17200 Nicotine dependence, unspecified, uncomplicated: Secondary | ICD-10-CM

## 2012-05-01 DIAGNOSIS — M7660 Achilles tendinitis, unspecified leg: Secondary | ICD-10-CM

## 2012-05-01 MED ORDER — LISINOPRIL-HYDROCHLOROTHIAZIDE 20-12.5 MG PO TABS: 1.0000 | ORAL_TABLET | Freq: Every day | ORAL | Status: DC

## 2012-05-01 NOTE — Patient Instructions (Signed)
Call [phone] quit now 

## 2012-05-01 NOTE — Progress Notes (Signed)
  Subjective:    Patient ID: Gabriela Davis, female    DOB: [birth year], 71 y.o.   MRN: [id number]  HPI She was recently seen in an urgent care Center and treated for Achilles tendinitis. There is also concerned about her vascular supply to her feet. She has had no foot pain or evidence of activity-related fatigue. She doesn't smoke. She does have a previous history of CVA.   Review of Systems     Objective:   Physical Exam Good motion of the ankle. No tenderness palpation over the insertion of the Achilles tendon or along the tendon. Pulses are normal. Capillary refill is also normal.      Assessment & Plan:   1. Achilles tendinitis of left lower extremity   2. Current smoker    I explained that I did not think she is a danger from her vascular supply. Strongly encouraged her to quit smoking. Recommend she call the 800 number. Since she's not having difficulty with her tendinitis, no further intervention is needed.

## 2012-05-14 ENCOUNTER — Telehealth: Payer: Self-pay | Admitting: Internal Medicine

## 2012-05-14 NOTE — Telephone Encounter (Signed)
LMOM FOR THE PATIENT ABOUT HER LEG AND THE BOOT. CLS

## 2012-05-14 NOTE — Telephone Encounter (Signed)
i would restart the boot x  1wk, ice, rest, stay off the leg, f/u here in 1wk

## 2012-08-15 ENCOUNTER — Ambulatory Visit: Payer: Medicare HMO | Admitting: Medical

## 2012-08-28 ENCOUNTER — Other Ambulatory Visit: Payer: Medicare HMO

## 2012-08-28 DIAGNOSIS — Z23 Encounter for immunization: Secondary | ICD-10-CM

## 2012-08-28 MED ORDER — INFLUENZA VIRUS VACC SPLIT PF IM SUSP: 0.5000 mL | Freq: Once | INTRAMUSCULAR | Status: AC

## 2012-10-21 ENCOUNTER — Telehealth: Payer: Self-pay

## 2012-10-21 ENCOUNTER — Other Ambulatory Visit: Payer: Self-pay

## 2012-10-21 DIAGNOSIS — Z1239 Encounter for other screening for malignant neoplasm of breast: Secondary | ICD-10-CM

## 2012-10-21 NOTE — Telephone Encounter (Signed)
PT CALLED AND STATED SHE WANTED ME TO MAKE HER AN APT FOR MAMMOGRAM SHE HAS NOT HAD ONE SINCE  SHE MOVED TO [place] IS THIS OK WITH YOU PLEASE ADVISE

## 2012-10-21 NOTE — Telephone Encounter (Signed)
Okay 

## 2012-11-20 ENCOUNTER — Ambulatory Visit: Payer: Medicare HMO | Admitting: Family Medicine

## 2012-11-26 ENCOUNTER — Encounter: Payer: Self-pay | Admitting: Family Medicine

## 2012-11-26 ENCOUNTER — Ambulatory Visit (INDEPENDENT_AMBULATORY_CARE_PROVIDER_SITE_OTHER): Payer: Medicare HMO | Admitting: Family Medicine

## 2012-11-26 VITALS — BP 120/80 | HR 96 | Temp 98.4°F

## 2012-11-26 DIAGNOSIS — M1990 Unspecified osteoarthritis, unspecified site: Secondary | ICD-10-CM

## 2012-11-26 DIAGNOSIS — J069 Acute upper respiratory infection, unspecified: Secondary | ICD-10-CM

## 2012-11-26 DIAGNOSIS — J45901 Unspecified asthma with (acute) exacerbation: Secondary | ICD-10-CM

## 2012-11-26 MED ORDER — CLARITHROMYCIN 500 MG PO TABS: 500.0000 mg | ORAL_TABLET | Freq: Two times a day (BID) | ORAL | Status: DC

## 2012-11-26 NOTE — Patient Instructions (Addendum)
Continue on your inhaler. Hopefully this will clear up on its own however if it does not, start on the antibiotic in several days.

## 2012-11-26 NOTE — Progress Notes (Signed)
  Subjective:    Patient ID: Gabriela Davis, female    DOB: 1941/11/14, 72 y.o.   MRN: [id number]  HPI Is a five-day history that started with postnasal drainage followed by a dry cough and sore throat and followed now by a productive cough and some shortness of breath but no fever, chills, earache. She continues to smoke but has cut back. She complains of wrist arthritic type pain. She's concerned or which medications can be used to help with this.   Review of Systems     Objective:   Physical Exam alert and in no distress. Tympanic membranes an and she can get this filled. d canals are normal. His mucosa is slightly erythematous Throat is clear. Tonsils are normal. Neck is supple without adenopathy or thyromegaly. Cardiac exam shows a regular sinus rhythm without murmurs or gallops. Lungs show expiratory wheezing that did clear after nebulizer treatment Slight tenderness to palpation over the ulnar styloid is noted.      Assessment & Plan:   1. URI, acute  clarithromycin (BIAXIN) 500 MG tablet  2. Asthma exacerbation    3. Osteoarthritis     she is to use her albuterol inhaler. If her symptoms do not clear over the next several days, Recommend she start with Tylenol and then add Advil or Aleve. If continued difficulty, recommend she return here for further evaluation.

## 2012-12-08 ENCOUNTER — Inpatient Hospital Stay (HOSPITAL_COMMUNITY)
Admission: EM | Admit: 2012-12-08 | Discharge: 2012-12-09 | DRG: 190 | Disposition: A | Discharge status: Home / Self Care | Payer: Medicare HMO | Attending: Internal Medicine | Admitting: Internal Medicine

## 2012-12-08 ENCOUNTER — Encounter (HOSPITAL_COMMUNITY): Payer: Self-pay | Admitting: Family Medicine

## 2012-12-08 ENCOUNTER — Ambulatory Visit (INDEPENDENT_AMBULATORY_CARE_PROVIDER_SITE_OTHER): Payer: Medicare HMO | Admitting: Medical

## 2012-12-08 ENCOUNTER — Encounter: Payer: Self-pay | Admitting: Medical

## 2012-12-08 ENCOUNTER — Emergency Department (HOSPITAL_COMMUNITY): Payer: Medicare HMO

## 2012-12-08 VITALS — BP 150/80 | HR 97 | Temp 98.2°F | Resp 18 | Wt 185.0 lb

## 2012-12-08 DIAGNOSIS — I10 Essential (primary) hypertension: Secondary | ICD-10-CM | POA: Diagnosis present

## 2012-12-08 DIAGNOSIS — R0602 Shortness of breath: Secondary | ICD-10-CM

## 2012-12-08 DIAGNOSIS — R609 Edema, unspecified: Secondary | ICD-10-CM

## 2012-12-08 DIAGNOSIS — Z66 Do not resuscitate: Secondary | ICD-10-CM | POA: Diagnosis present

## 2012-12-08 DIAGNOSIS — I5033 Acute on chronic diastolic (congestive) heart failure: Secondary | ICD-10-CM | POA: Diagnosis present

## 2012-12-08 DIAGNOSIS — R0902 Hypoxemia: Secondary | ICD-10-CM

## 2012-12-08 DIAGNOSIS — J440 Chronic obstructive pulmonary disease with acute lower respiratory infection: Principal | ICD-10-CM | POA: Diagnosis present

## 2012-12-08 DIAGNOSIS — R05 Cough: Secondary | ICD-10-CM

## 2012-12-08 DIAGNOSIS — Z885 Allergy status to narcotic agent status: Secondary | ICD-10-CM

## 2012-12-08 DIAGNOSIS — Z23 Encounter for immunization: Secondary | ICD-10-CM

## 2012-12-08 DIAGNOSIS — E785 Hyperlipidemia, unspecified: Secondary | ICD-10-CM | POA: Diagnosis present

## 2012-12-08 DIAGNOSIS — R5383 Other fatigue: Secondary | ICD-10-CM

## 2012-12-08 DIAGNOSIS — Z79899 Other long term (current) drug therapy: Secondary | ICD-10-CM

## 2012-12-08 DIAGNOSIS — J40 Bronchitis, not specified as acute or chronic: Secondary | ICD-10-CM

## 2012-12-08 DIAGNOSIS — I509 Heart failure, unspecified: Secondary | ICD-10-CM

## 2012-12-08 DIAGNOSIS — R635 Abnormal weight gain: Secondary | ICD-10-CM

## 2012-12-08 DIAGNOSIS — J209 Acute bronchitis, unspecified: Principal | ICD-10-CM | POA: Diagnosis present

## 2012-12-08 DIAGNOSIS — Z881 Allergy status to other antibiotic agents status: Secondary | ICD-10-CM

## 2012-12-08 DIAGNOSIS — R7989 Other specified abnormal findings of blood chemistry: Secondary | ICD-10-CM | POA: Diagnosis present

## 2012-12-08 DIAGNOSIS — Z8673 Personal history of transient ischemic attack (TIA), and cerebral infarction without residual deficits: Secondary | ICD-10-CM

## 2012-12-08 DIAGNOSIS — Z880 Allergy status to penicillin: Secondary | ICD-10-CM

## 2012-12-08 DIAGNOSIS — R799 Abnormal finding of blood chemistry, unspecified: Secondary | ICD-10-CM

## 2012-12-08 DIAGNOSIS — R0989 Other specified symptoms and signs involving the circulatory and respiratory systems: Secondary | ICD-10-CM

## 2012-12-08 DIAGNOSIS — R600 Localized edema: Secondary | ICD-10-CM | POA: Diagnosis present

## 2012-12-08 DIAGNOSIS — R0600 Dyspnea, unspecified: Secondary | ICD-10-CM

## 2012-12-08 DIAGNOSIS — Z720 Tobacco use: Secondary | ICD-10-CM | POA: Diagnosis present

## 2012-12-08 DIAGNOSIS — F17200 Nicotine dependence, unspecified, uncomplicated: Secondary | ICD-10-CM | POA: Diagnosis present

## 2012-12-08 LAB — BASIC METABOLIC PANEL
BUN: 11 mg/dL (ref 6–23)
Calcium: 9.5 mg/dL (ref 8.4–10.5)
GFR calc Af Amer: >90 mL/min (ref >90)
GFR calc non Af Amer: 86 mL/min — ABNORMAL LOW (ref >90)
Glucose, Bld: 94 mg/dL (ref 70–99)

## 2012-12-08 LAB — CBC
HCT: 42 % (ref 36.0–46.0)
Hemoglobin: 14.3 g/dL (ref 12.0–15.0)
MCH: 33.7 pg (ref 26.0–34.0)
MCH: 34.2 pg — ABNORMAL HIGH (ref 26.0–34.0)
MCHC: 34 g/dL (ref 30.0–36.0)
MCV: 99.1 fL (ref 78.0–100.0)
MCV: 98.5 fL (ref 78.0–100.0)
Platelets: 272 K/uL (ref 150–400)
Platelets: 243 10*3/uL (ref 150–400)
RBC: 4.24 MIL/uL (ref 3.87–5.11)
RDW: 13.2 % (ref 11.5–15.5)
RDW: 13.1 % (ref 11.5–15.5)
WBC: 5.8 K/uL (ref 4.0–10.5)

## 2012-12-08 LAB — POCT I-STAT TROPONIN I: Troponin i, poc: 0.01 ng/mL (ref 0.00–0.08)

## 2012-12-08 LAB — BASIC METABOLIC PANEL WITH GFR
CO2: 30 meq/L (ref 19–32)
Chloride: 95 meq/L — ABNORMAL LOW (ref 96–112)
Creatinine, Ser: 0.69 mg/dL (ref 0.50–1.10)
Potassium: 4.3 meq/L (ref 3.5–5.1)
Sodium: 133 meq/L — ABNORMAL LOW (ref 135–145)

## 2012-12-08 LAB — TROPONIN I: Troponin I: <0.3 ng/mL (ref <0.30)

## 2012-12-08 LAB — D-DIMER, QUANTITATIVE: D-Dimer, Quant: 2.53 ug/mL-FEU — ABNORMAL HIGH (ref 0.00–0.48)

## 2012-12-08 LAB — PRO B NATRIURETIC PEPTIDE: Pro B Natriuretic peptide (BNP): 3490 pg/mL — ABNORMAL HIGH (ref 0–125)

## 2012-12-08 MED ORDER — ALBUTEROL SULFATE (5 MG/ML) 0.5% IN NEBU
2.5000 mg | INHALATION_SOLUTION | RESPIRATORY_TRACT | Status: DC
  Administered 2012-12-08 – 2012-12-09 (×3): 2.5 mg via RESPIRATORY_TRACT
  Filled 2012-12-08: qty 0.5

## 2012-12-08 MED ORDER — METHYLPREDNISOLONE SODIUM SUCC 125 MG IJ SOLR
125.0000 mg | Freq: Once | INTRAMUSCULAR | Status: AC
  Administered 2012-12-08: 125 mg via INTRAVENOUS
  Filled 2012-12-08: qty 2

## 2012-12-08 MED ORDER — PREDNISONE 50 MG PO TABS
50.0000 mg | ORAL_TABLET | Freq: Every day | ORAL | Status: DC
Start: 2012-12-09 — End: 2012-12-09
  Administered 2012-12-09: 50 mg via ORAL
  Filled 2012-12-08 (×2): qty 1

## 2012-12-08 MED ORDER — ALBUTEROL SULFATE (5 MG/ML) 0.5% IN NEBU
5.0000 mg | INHALATION_SOLUTION | Freq: Once | RESPIRATORY_TRACT | Status: AC
  Administered 2012-12-08: 5 mg via RESPIRATORY_TRACT
  Filled 2012-12-08 (×2): qty 1

## 2012-12-08 MED ORDER — ISOSORB DINITRATE-HYDRALAZINE 20-37.5 MG PO TABS
1.0000 | ORAL_TABLET | Freq: Two times a day (BID) | ORAL | Status: DC
  Administered 2012-12-08 – 2012-12-09 (×2): 1 via ORAL
  Filled 2012-12-08 (×3): qty 1

## 2012-12-08 MED ORDER — LEVOFLOXACIN IN D5W 500 MG/100ML IV SOLN
500.0000 mg | INTRAVENOUS | Status: DC
  Administered 2012-12-08: 500 mg via INTRAVENOUS
  Filled 2012-12-08 (×2): qty 100

## 2012-12-08 MED ORDER — ONDANSETRON HCL 4 MG/2ML IJ SOLN: 4.0000 mg | Freq: Four times a day (QID) | INTRAMUSCULAR | Status: DC | PRN

## 2012-12-08 MED ORDER — PANTOPRAZOLE SODIUM 40 MG PO TBEC: 40.0000 mg | DELAYED_RELEASE_TABLET | Freq: Every day | ORAL | Status: DC

## 2012-12-08 MED ORDER — FUROSEMIDE 10 MG/ML IJ SOLN
40.0000 mg | Freq: Once | INTRAMUSCULAR | Status: AC
  Administered 2012-12-08: 40 mg via INTRAVENOUS
  Filled 2012-12-08: qty 4

## 2012-12-08 MED ORDER — ACETAMINOPHEN 325 MG PO TABS: 650.0000 mg | ORAL_TABLET | ORAL | Status: DC | PRN

## 2012-12-08 MED ORDER — NITROGLYCERIN 0.4 MG SL SUBL
0.4000 mg | SUBLINGUAL_TABLET | Freq: Once | SUBLINGUAL | Status: DC
  Filled 2012-12-08: qty 25

## 2012-12-08 MED ORDER — LISINOPRIL 20 MG PO TABS
20.0000 mg | ORAL_TABLET | Freq: Every day | ORAL | Status: DC
  Administered 2012-12-08 – 2012-12-09 (×2): 20 mg via ORAL
  Filled 2012-12-08 (×2): qty 1

## 2012-12-08 MED ORDER — IPRATROPIUM BROMIDE 0.02 % IN SOLN
0.5000 mg | Freq: Once | RESPIRATORY_TRACT | Status: AC
  Administered 2012-12-08: 0.5 mg via RESPIRATORY_TRACT
  Filled 2012-12-08: qty 2.5

## 2012-12-08 MED ORDER — ALBUTEROL SULFATE (5 MG/ML) 0.5% IN NEBU
5.0000 mg | INHALATION_SOLUTION | Freq: Once | RESPIRATORY_TRACT | Status: AC
  Administered 2012-12-08: 5 mg via RESPIRATORY_TRACT
  Filled 2012-12-08: qty 1

## 2012-12-08 MED ORDER — ENOXAPARIN SODIUM 40 MG/0.4ML ~~LOC~~ SOLN
40.0000 mg | SUBCUTANEOUS | Status: DC
  Administered 2012-12-08: 40 mg via SUBCUTANEOUS
  Filled 2012-12-08 (×2): qty 0.4

## 2012-12-08 MED ORDER — FUROSEMIDE 10 MG/ML IJ SOLN
40.0000 mg | Freq: Three times a day (TID) | INTRAMUSCULAR | Status: DC
  Administered 2012-12-08 – 2012-12-09 (×2): 40 mg via INTRAVENOUS
  Filled 2012-12-08 (×5): qty 4

## 2012-12-08 MED ORDER — ALBUTEROL SULFATE (5 MG/ML) 0.5% IN NEBU
2.5000 mg | INHALATION_SOLUTION | Freq: Four times a day (QID) | RESPIRATORY_TRACT | Status: AC
  Filled 2012-12-08 (×2): qty 0.5

## 2012-12-08 MED ORDER — SODIUM CHLORIDE 0.9 % IJ SOLN: 3.0000 mL | INTRAMUSCULAR | Status: DC | PRN

## 2012-12-08 MED ORDER — BUDESONIDE 0.25 MG/2ML IN SUSP
0.2500 mg | Freq: Two times a day (BID) | RESPIRATORY_TRACT | Status: DC
  Administered 2012-12-08 – 2012-12-09 (×2): 0.25 mg via RESPIRATORY_TRACT
  Filled 2012-12-08 (×4): qty 2

## 2012-12-08 MED ORDER — PNEUMOCOCCAL VAC POLYVALENT 25 MCG/0.5ML IJ INJ
0.5000 mL | INJECTION | INTRAMUSCULAR | Status: AC
  Administered 2012-12-09: 0.5 mL via INTRAMUSCULAR
  Filled 2012-12-08: qty 0.5

## 2012-12-08 MED ORDER — SODIUM CHLORIDE 0.9 % IJ SOLN
3.0000 mL | Freq: Two times a day (BID) | INTRAMUSCULAR | Status: DC
  Administered 2012-12-08 – 2012-12-09 (×2): 3 mL via INTRAVENOUS

## 2012-12-08 MED ORDER — IPRATROPIUM BROMIDE 0.02 % IN SOLN
0.5000 mg | RESPIRATORY_TRACT | Status: DC
  Administered 2012-12-08 – 2012-12-09 (×3): 0.5 mg via RESPIRATORY_TRACT
  Filled 2012-12-08 (×3): qty 2.5

## 2012-12-08 MED ORDER — SODIUM CHLORIDE 0.9 % IV SOLN
250.0000 mL | INTRAVENOUS | Status: DC | PRN
  Administered 2012-12-08: 250 mL via INTRAVENOUS

## 2012-12-08 NOTE — Progress Notes (Signed)
Subjective: Here for not feeling well.  Been feeling bad for 2 weeks.  Hurts to take a deep breath.  Legs are swelling, this started about 2 weeks ago.  Fatigued, wore out, can't walk up stairs without getting out of breath.   Denies fever.  She does report a lot of cough, cough is productive greenish color, but later in the day clear.  No red or pink frothy color.  No palpitations of the heart.  Just has no energy.  She is worried about her heart.  Was seen here recently for respiratory infection and is currently taking Biaxin, just started this Thursday but not improving.     Past Medical History  Diagnosis Date  . Hypertension   . COPD (chronic obstructive pulmonary disease)   . Dyslipidemia   . History of CVA (cerebrovascular accident)   . Tobacco use disorder    Review of Systems Constitutional: -fever, -chills, -sweats, +weight change, +fatigue ENT: +runny nose, +sinus pressure, -ear pain, -sore throat Cardiology:   -palpitations Gastroenterology: -abdominal pain, -nausea, -vomiting, -diarrhea, -constipation Musculoskeletal: -arthralgias, -myalgias, -joint swelling, +pain in back Urology: -dysuria, -difficulty urinating,no decreased output, -hematuria, -urinary frequency, -urgency Neurology: -headache, -weakness, -tingling, -numbness  Objective: Gen: wd, wn, nad Skin: warm, dry Heent: sinuses tender, TMs pearly, nares patent, pharynx normal Oral: MMM, left upper molar in decay, no other lesions Neck: supple, nontender, mild JVP increased, no mass, no thyromegaly, no mass Lungs: decreased breath sounds bilat lower fields, faint rales right lower feels, few faint wheezes with expiration Heart:  ectopic beats heard, no murmurs, otherwise fairly regular Abdomen: +bs, soft, nontender, no mass Pulses:  ectopic beats, otherwsie 1+ UE and LE Ext: slight nonpitting edema in bilat ankles/feet   Adult ECG Report  Indication: DOE, SOB  Rate: 85 bpm  Rhythm: sinus rhythm with occasional  premature ventricular complexes and premature atrial complexes  QRS Axis: -16 degrees  PR Interval: 136 ms  QRS Duration: 92ms  QTc:  Conduction Disturbances: PVCs and PACs  Other Abnormalities: Q in III (new)  Patient's cardiac risk factors are: advanced age (older than 74 for men, 47 for women), hypertension, obesity (BMI >= 30 kg/m2), sedentary lifestyle and smoking/ tobacco exposure.  EKG comparison: compared to 01/11/10 new q wave in III, new PVC, new PACs,   Narrative Interpretation: left axis deviation, can't rule out anterior infarct   Assessment: Encounter Diagnoses  Name Primary?  . SOB (shortness of breath) Yes  . DOE (dyspnea on exertion)   . Fatigue   . Cough   . Weight gain     Pulse ox on presentation 91% room air and somewhat dyspneic at times.  Desaturated to 86% walking about 3 minutes around the office.   Desaturated from 96% to 92% just standing in the exam room from the table to chair.  Her last weight here was 164lb in June, but currently is 185lb.  She likely has gained about 10lb in the last 2 wk per her report.  EKG changed from prior.  Abnormal lungs sounds today.    Plan: She has underlying COPD, is an active smoker, does have respiratory complaints, on Biaxin, but in light of her worsening symptoms, CHF seems a likely diagnosis.    Dr. Susann Givens and I both discussed her findings with her.  After getting EKG, discussed findings, symptoms, and possible differential with her including CHF, ischemia, pneumonia and other.   We advised she be transported to the ED by EMS.  She refused  transport.   However, she does agree to report to the ED now by personal vehicle.  We discussed possible risks of not going being evaluated and treated in the ED urgently at this time.  She is aware of this risks and is leaving against our advice but will report to [facility] ED by personal vehicle.  I called the ED triage to advise she will be coming for evaluation.

## 2012-12-08 NOTE — ED Notes (Signed)
Negative for DVT's bilaterally

## 2012-12-08 NOTE — ED Provider Notes (Signed)
History     CSN: [id number]  Arrival date & time 12/08/12  1350   First MD Initiated Contact with Patient 12/08/12 1428      Chief Complaint  Patient presents with  . Shortness of Breath    (Consider location/radiation/quality/duration/timing/severity/associated sxs/prior treatment) HPI Comments: Patient with a history of ongoing tobacco use with possible history of COPD presents to to 2 weeks of severe fatigue, myalgias, shortness of breath that is worse with exertion. No chest pain but has had a nagging pain in her upper left back area. It seems to be worse with deep breath. She has a occasionally productive cough that has been paroxysmal. No fevers or chills. She did have bodyaches about a week ago which seems improved. She has been taking over-the-counter medications, naproxen with transient mild improvement of her symptoms. She also has an inhaler at home that she's been using which also only helped very mildly. She was seen by her primary care physician last week and was told to start taking an oral antibiotic if her symptoms did not improve over the next few days. She felt that antibiotic on Thursday and has had 4 days of antibiotic reports not feeling any better. She went back to see her primary care doctor and was sent here to the emergency department for further evaluation and treatment. Her initial oxygen saturation at triage was 88% on room air. She does not use home oxygen normally. She denies history of coronary disease or congestive heart failure. She has noted some swelling of her feet and ankles. Appetite has been mildly decreased.    Patient is a 72 y.o. female presenting with shortness of breath. The history is provided by the patient and a relative.  Shortness of Breath  Associated symptoms include cough and shortness of breath. Pertinent negatives include no chest pain and no fever.    Past Medical History  Diagnosis Date  . Hypertension   . COPD (chronic obstructive  pulmonary disease)   . Dyslipidemia   . History of CVA (cerebrovascular accident)   . Tobacco use disorder     Past Surgical History  Procedure Date  . Laminectomy     L4-5  . Cholecystectomy   . Knee surgery     x 2-3, L knee  . Abdominal hysterectomy     History reviewed. No pertinent family history.  History  Substance Use Topics  . Smoking status: Current Every Day Smoker -- 0.5 packs/day    Types: Cigarettes  . Smokeless tobacco: Never Used     Comment: cutting back, now smoking 6 cigarettes daily  . Alcohol Use: 1.0 oz/week    2 drink(s) per week     Comment: 2-3 beers every 2 weeks    OB History    Grav Para Term Preterm Abortions TAB SAB Ect Mult Living                  Review of Systems  Constitutional: Positive for fatigue. Negative for fever and chills.  Respiratory: Positive for cough and shortness of breath.   Cardiovascular: Positive for leg swelling. Negative for chest pain and palpitations.  Gastrointestinal: Negative for nausea, vomiting and abdominal pain.  Musculoskeletal: Positive for back pain.  Neurological: Positive for weakness. Negative for dizziness, syncope, light-headedness and numbness.  All other systems reviewed and are negative.    Allergies  Cephalexin; Morphine and related; and Penicillins  Home Medications   Current Outpatient Rx  Name  Route  Sig  Dispense  Refill  . ALBUTEROL SULFATE HFA 108 (90 BASE) MCG/ACT IN AERS   Inhalation   Inhale 2 puffs into the lungs every 6 (six) hours as needed for wheezing.   1 Inhaler   0   . LISINOPRIL-HYDROCHLOROTHIAZIDE 20-12.5 MG PO TABS   Oral   Take 1 tablet by mouth daily.   30 tablet   prn   . NAPROXEN SODIUM 220 MG PO TABS   Oral   Take 220 mg by mouth 2 (two) times daily with a meal.           BP 139/78  Temp 98.6 F (37 C) (Oral)  Resp 20  SpO2 88%  Physical Exam  Nursing note and vitals reviewed. Constitutional: She is oriented to person, place, and time.  She appears well-developed and well-nourished.  HENT:  Head: Normocephalic and atraumatic.  Eyes: EOM are normal. Pupils are equal, round, and reactive to light. No scleral icterus.  Neck: Normal range of motion. Neck supple.  Cardiovascular: Normal rate and regular rhythm.   No murmur heard. Pulmonary/Chest: Effort normal. No respiratory distress. She has no wheezes. She has no rales.  Abdominal: Soft. She exhibits no distension. There is no tenderness.  Neurological: She is alert and oriented to person, place, and time. Coordination normal.  Skin: Skin is warm and dry.  Psychiatric: She has a normal mood and affect.    ED Course  Procedures (including critical care time)  Labs Reviewed  BASIC METABOLIC PANEL - Abnormal; Notable for the following:    Sodium 133 (*)     Chloride 95 (*)     GFR calc non Af Amer 86 (*)     All other components within normal limits  PRO B NATRIURETIC PEPTIDE - Abnormal; Notable for the following:    Pro B Natriuretic peptide (BNP) 3490.0 (*)     All other components within normal limits  CBC  POCT I-STAT TROPONIN I   Dg Chest 2 View  12/08/2012  *RADIOLOGY REPORT*  Clinical Data: Shortness of breath.  CHEST - 2 VIEW  Comparison: 11/25/2007.  Findings: Trachea is midline.  Heart is enlarged.  There is diffuse interstitial prominence and indistinctness with bilateral pleural effusions.  Calcified granuloma is seen in the left upper lobe.  IMPRESSION: Mild congestive heart failure.   Original Report Authenticated By: Leanna Battles, M.D.      1. Dyspnea   2. Hypoxia   3. Bronchitis   4. CHF (congestive heart failure)     Room air oxygenation saturation is 88% which I interpret to be abnormal and low. EKG performed at time 14:08, shows normal sinus rhythm at a rate of 96. Borderline low QRS voltages are noted. Axis is normal. Poor R wave progression is noted between leads V1 through V3. No ST or T-wave abnormalities. Compared to her office EKG, Q  waves noted in lead 3 are no longer seen. No other prior EKGs are available through our system.  4:46 PM Patient reports that after her nebulizer treatments as well as IV Lasix and she does use the bathroom 3 times in total now reports feeling much improved, no longer short of breath. She requests that she be released to go home. I've encouraged that she followup with Dr. Daisey Must and I think that she would benefit from an outpatient cardiology evaluation given her elevated BNP and chest x-ray suggestive of mild congestive heart failure. My plan would be to treat both dyspnea do to CHF exacerbation as well as  bronchitis. I will give her prednisone and encourage her to continue her inhaler treatments. I also provide 20 mg of Lasix daily at home provided that her hypoxia has resolved here.   5:16 PM Patient remains hypoxic at 89% on room air. Patient is symptomatically improved however, hypoxia requires that she be admitted. She may benefit from continued diuresis as well as continued treatment for bronchitis which may improve her hypoxia. A consideration would be a CAT scan although she has no risk factors and no signs other than her hypoxia for PE. plan is to consult with [facility] hospitalist for admission.  MDM   Patient with dyspnea that has been gradually worsening over the last 2 weeks associated with expiratory wheezing. Patient is a smoker and has history of COPD. Patient is mildly hypoxic here in emergency department but otherwise vital signs are unremarkable. Patient's BNP is mildly elevated and chest x-ray shows some edema which is suggestive of congestive heart failure. Patient has no prior history. Her blood pressure is mildly elevated at 150/80 initially. Patient is given some sublingual nitroglycerin here as well as IV Lasix. Patient also had been given some albuterol, Atrovent and IV Solu-Medrol for presumed bronchitis and COPD exacerbation.  Troponin is normal.          Gavin Pound. Kaheem Halleck,  MD 12/08/12 1718

## 2012-12-08 NOTE — Progress Notes (Signed)
*  Preliminary Results* Bilateral lower extremity venous duplex completed. Bilateral lower extremities are negative for deep vein thrombosis. No evidence of Baker's cyst bilaterally. Preliminary results discussed with Karolee Stamps, RN.  12/08/2012 6:53 PM Gertie Fey, RDMS, RDCS

## 2012-12-08 NOTE — ED Notes (Signed)
Attempted to call report again; RN still unavailable;

## 2012-12-08 NOTE — ED Notes (Addendum)
Per pt sts sent here by her doctor for SOB. sts has been SOB x 2 weeks. sts pain in back on left side when she coughs or takes a deep breath. sts low sats, productive cough and swelling in legs. Recent weight gain, rales, decreased breath sounds. Also abnormal EKG. Possible CHF

## 2012-12-08 NOTE — H&P (Signed)
[facility] History and Physical  Gabriela Davis ZOX:[id number] DOB: 1941/11/07 DOA: 12/08/2012  Referring physician: Dr. Oletta Lamas PCP: Carollee Herter, MD   Chief Complaint: Shortness of breath, hypoxia, cough, lower extremities swelling  HPI: Gabriela Davis is a 72 y.o. female past medical history significant for hypertension, COPD, remote history of CVA (no residual deficit), dyslipidemia and ongoing tobacco abuse; come to the hospital secondary to gradually but worsening shortness of breath and dyspnea over the last 2 weeks. Patient also reports increased expiratory wheezing and needs for her albuterol inhaler without significant improvement on her symptoms. She has been seen by PCP about week prior to this admission was placed on Biaxin for bronchitis and instructed to continue using her inhaler; symptoms failed to improved and prompted patient to come to ED. in the emergency department a chest x-ray demonstrating mild congestive heart failure with mild bilateral pleural effusions; she was also found with elevated BNP and hypoxic (oxygen saturation 86% on room air). Patient received IV Lasix, Solu Medrol and albuterol nebulizer. Patient symptoms improved significantly she remains hypoxic on room air at 88-89%, at that moment [facility] has been called to admit the patient for further evaluation and treatment.   Other pertinent positive review of systems includes bilateral symmetrically increased swelling of her lower extremities, difficulty laying flat on bed, productive cough (white/yellowish sputum) increased wheezing and shortness of breath.  Of note patient denies any fever, chills, chest pain, nausea, vomiting, abdominal pain, hematemesis, melena, hematochezia or any other acute complaints.    Review of Systems:  Negative except as otherwise mentioned on history of present illness.  Past Medical History  Diagnosis Date  . Hypertension   . COPD (chronic obstructive  pulmonary disease)   . Dyslipidemia   . History of CVA (cerebrovascular accident)   . Tobacco use disorder    Past Surgical History  Procedure Date  . Laminectomy     L4-5  . Cholecystectomy   . Knee surgery     x 2-3, L knee  . Abdominal hysterectomy    Social History:  reports that she has been smoking Cigarettes.  She has been smoking about .5 packs per day. She has never used smokeless tobacco. She reports that she drinks about one ounce of alcohol per week. She reports that she does not use illicit drugs. patient lives at home with her husband at this moment do not require assistance with any activities of daily living. 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   Allergies  Allergen Reactions  . Cephalexin Anaphylaxis  . Morphine And Related Nausea And Vomiting  . Penicillins Swelling    SEVERE    Family history: Pertinent Just for hypertension on her mother side. No history of heart problems or cancer on first degree relatives.  Prior to Admission medications   Medication Sig Start Date End Date Taking? Authorizing Provider  albuterol (PROVENTIL HFA;VENTOLIN HFA) 108 (90 BASE) MCG/ACT inhaler Inhale 2 puffs into the lungs every 6 (six) hours as needed for wheezing. 04/08/12 04/08/13 Yes Kermit Balo Tysinger, PA  lisinopril-hydrochlorothiazide (PRINZIDE,ZESTORETIC) 20-12.5 MG per tablet Take 1 tablet by mouth daily. 05/01/12  Yes Ronnald Nian, MD  naproxen sodium (ANAPROX) 220 MG tablet Take 220 mg by mouth 2 (two) times daily with a meal.   Yes Historical Provider, MD   Physical Exam: Filed Vitals:   12/08/12 1416 12/08/12 1652 12/08/12 1740  BP: 139/78 163/84   Pulse:  85   Temp: 98.6 F (  37 C)    TempSrc: Oral    Resp: 20 14   SpO2: 88% 89% 96%     General:  No acute distress, after receiving nebulizer treatments and Solu Medrol/Lasix in the ED, was able to speak in full sentences and feeling a lot better.  Eyes: No icterus, no nystagmus,  extraocular muscles intact, PERRLA  ENT: Moist mucous membranes, good dentition, no erythema or exudates inside her mouth, no paranasal sinuses tenderness, no drainage out of her nostrils or ears.  Neck: Supple, no thyromegaly, no bruits  Cardiovascular: S1 and S2, regular rate, no rubs, no gallops  Respiratory: Expiratory wheezing, decreased breath sounds at her bases bilaterally, scattered rhonchi  Abdomen: Soft, nontender, nondistended, positive bowel sounds  Skin: No rash, no petechiae, no open wounds.  Musculoskeletal: Full range of motion, no joint erythema or joint swelling.  Psychiatric: No suicidal ideation, no hallucinations, mood stable and appropriate for situation.  Neurologic: Alert, awake and oriented x3, cranial nerve intact, muscle strength 5 out of 5 bilaterally and symmetrically, normal muscle tone and reflexes, no focal motor or sensory deficit appreciated on exam.  Labs on Admission:  Basic Metabolic Panel:  Lab 12/08/12 4540  NA 133*  K 4.3  CL 95*  CO2 30  GLUCOSE 94  BUN 11  CREATININE 0.69  CALCIUM 9.5  MG --  PHOS --   CBC:  Lab 12/08/12 1426  WBC 5.8  NEUTROABS --  HGB 14.3  HCT 42.0  MCV 99.1  PLT 272   BNP (last 3 results)  Basename 12/08/12 1427  PROBNP 3490.0*    Radiological Exams on Admission: Dg Chest 2 View  12/08/2012  *RADIOLOGY REPORT*  Clinical Data: Shortness of breath.  CHEST - 2 VIEW  Comparison: 11/25/2007.  Findings: Trachea is midline.  Heart is enlarged.  There is diffuse interstitial prominence and indistinctness with bilateral pleural effusions.  Calcified granuloma is seen in the left upper lobe.  IMPRESSION: Mild congestive heart failure.   Original Report Authenticated By: Leanna Battles, M.D.     EKG: Normal sinus rhythm, left axis, no acute ischemic changes.  Assessment/Plan 1-SOB (shortness of breath): Multifactorial in etiology, secondary to bronchitis with worsening of her COPD exacerbation and also  findings suggesting CHF. Mother remote consideration might be pulmonary embolism (last one was likely) -Admit the patient to telemetry -Cycle cardiac enzymes, check TSH, 2-D echo, daily weights and close eyes and nose. -Start treatment with Lasix 40 mg every 8 hours -DuoNeb's, her antibiotics and oxygen supplementation. -Will check d-dimer and also lower extremities Doppler  2-HLD (hyperlipidemia): Will check lipid panel and depending results determine if she needs a no statins.  3-HTN (hypertension): Mildly elevated. Will continue treatment with her lisinopril, will also use Lasix as mentioned above for diuresis; per CHF protocol will also start BiDil  4-Tobacco abuse: Counseling has been provided. Patient contemplating to quit. Has refused nicotine patch.  5-History of CVA (cerebrovascular accident): No residual deficit from previous stroke. No complaints of any focal neurologic abnormalities prior this admission or on exam. Will continue aspirin for secondary prevention the patient advised to stop smoking.  6-Elevated brain natriuretic peptide (BNP) level: Elevated at 4000, no history of kidney dysfunction or prior heart failure. Patient has been long-standing hypertensive patient which increase risk for diastolic dysfunction. Workup as mentioned above for onset of new heart failure diagnosis.  7-Edema extremities: Bilaterally and symmetrically, most likely due to CHF. Will check lower extremities Dopplers to be thorough I rule out  any DVT. Patient denies any erythema or pain; negative Hofmann signs on physical exam.  8-hypoxia: Secondary to COPD/CHF exacerbation and ongoing bronchitis. Treatment as mentioned above.  DVT: Lovenox   Code Status: DO NOT RESUSCITATE Family Communication: Husband Arlys John at bedside Disposition Plan: She will be admitted to telemetry bed, inpatient status, further evaluation and treatment of her shortness of breath; length of stay more than 2 midnights.  Time  spent: >30 minutes  Tasheika Kitzmiller [facility] Pager [phone]  If 7PM-7AM, please contact night-coverage [email or web address] Password [facility] 12/08/2012, 6:21 PM

## 2012-12-08 NOTE — Patient Instructions (Signed)
Our patient, Gabriela Davis 71yo sedentary obese female was seen today with 2 week hx/o SOB, DOE, desaturating to 86% with activity in office today, cough, productive cough, swelling in legs.  Recent weight gain, rales and decreased breath sounds on lung exam today, abnormal EKG today, questionable CHF.     We advised her to be taken by EMS but she refused.   She will report by personal car to [facility] emergency dept now.

## 2012-12-08 NOTE — ED Notes (Signed)
Patient ambulated well down hallway. Pulse oximetry remained at 89% while ambulating.

## 2012-12-09 DIAGNOSIS — J40 Bronchitis, not specified as acute or chronic: Secondary | ICD-10-CM

## 2012-12-09 DIAGNOSIS — R0609 Other forms of dyspnea: Secondary | ICD-10-CM

## 2012-12-09 LAB — TROPONIN I: Troponin I: <0.3 ng/mL (ref <0.30)

## 2012-12-09 LAB — LIPID PANEL
HDL: 72 mg/dL (ref >39)
Total CHOL/HDL Ratio: 2.7 RATIO
Triglycerides: 59 mg/dL (ref <150)

## 2012-12-09 LAB — TSH: TSH: 1.501 u[IU]/mL (ref 0.350–4.500)

## 2012-12-09 LAB — BASIC METABOLIC PANEL
CO2: 31 mEq/L (ref 19–32)
Calcium: 9.3 mg/dL (ref 8.4–10.5)
Creatinine, Ser: 0.76 mg/dL (ref 0.50–1.10)
Glucose, Bld: 144 mg/dL — ABNORMAL HIGH (ref 70–99)
Sodium: 135 mEq/L (ref 135–145)

## 2012-12-09 MED ORDER — POTASSIUM CHLORIDE ER 10 MEQ PO TBCR: 20.0000 meq | EXTENDED_RELEASE_TABLET | Freq: Every day | ORAL | Status: DC | PRN

## 2012-12-09 MED ORDER — ALBUTEROL SULFATE HFA 108 (90 BASE) MCG/ACT IN AERS: 2.0000 | INHALATION_SPRAY | Freq: Four times a day (QID) | RESPIRATORY_TRACT | Status: DC | PRN

## 2012-12-09 MED ORDER — LEVOFLOXACIN 500 MG PO TABS: 500.0000 mg | ORAL_TABLET | Freq: Every day | ORAL | Status: DC

## 2012-12-09 MED ORDER — FUROSEMIDE 40 MG PO TABS: 40.0000 mg | ORAL_TABLET | Freq: Every day | ORAL | Status: DC | PRN

## 2012-12-09 MED ORDER — METOPROLOL TARTRATE 50 MG PO TABS: 25.0000 mg | ORAL_TABLET | Freq: Two times a day (BID) | ORAL | Status: DC

## 2012-12-09 NOTE — Progress Notes (Signed)
IV d/c'd.  Tele d/c'd.  Pt d/c'd to home.  Home meds and d/c instructions have been discussed with pt.  Pt denies any questions or concerns at this time.  Pt ambulating off unit with family and appears in no acute distress. Nino Glow RN

## 2012-12-09 NOTE — Discharge Summary (Addendum)
[facility]                                                                                   Gabriela Davis, is a 72 y.o. female  DOB Jul 06, 1941  MRN [id number].  Admission date:  12/08/2012  Discharge Date:  12/09/2012  Primary MD  Carollee Herter, MD  Admitting Physician  No admitting provider for patient encounter.  Admission Diagnosis  Essential hypertension, benign [401.1] CHF (congestive heart failure) [428.0] Tobacco abuse [305.1] HTN (hypertension) [401.9] Dyspnea [786.09] SOB (shortness of breath) [786.05] Bronchitis [490] Edema extremities [782.3] HLD (hyperlipidemia) [272.4] Hypoxia [799.02] History of CVA (cerebrovascular accident) [V12.54] Elevated brain natriuretic peptide (BNP) level [790.99] Hypoxia   Discharge Diagnosis     Principal Problem:  *SOB (shortness of breath) Active Problems:  HLD (hyperlipidemia)  HTN (hypertension)  Tobacco abuse  History of CVA (cerebrovascular accident)  Elevated brain natriuretic peptide (BNP) level  Edema extremities    Past Medical History  Diagnosis Date  . Hypertension   . COPD (chronic obstructive pulmonary disease)   . Dyslipidemia   . History of CVA (cerebrovascular accident)   . Tobacco use disorder     Past Surgical History  Procedure Date  . Laminectomy     L4-5  . Cholecystectomy   . Knee surgery     x 2-3, L knee  . Abdominal hysterectomy      Recommendations for primary care physician for things to follow:   Follow weight, BMP CLOSELY   Discharge Diagnoses:   Principal Problem:  *SOB (shortness of breath) Active Problems:  HLD (hyperlipidemia)  HTN (hypertension)  Tobacco abuse  History of CVA (cerebrovascular accident)  Elevated brain natriuretic peptide (BNP) level  Edema extremities    Discharge Condition: stable   Diet recommendation: See Discharge Instructions below   Consults none   History of present illness and  Hospital Course:  See  H&P, Labs, Consult and Test reports for all details in brief, patient was admitted for shortness of breath due to mild COPD exacerbation along with acute on chronic CHF Diastolic Ef 45-50%, patient was treated with IV steroids, empiric antibiotic and IV Lasix with good effect, now she is feeling back to baseline, has no edema or rales on exam, is off of oxygen and is emanating in the hall without any discomfort, she has been counseled to quit smoking, at this point she does not want to stay in the hospital anymore or see a cardiologist. She says it is a echogram does not come back soon she sign out AMA, I have requested to stay for a few more hours I'm trying to get an echogram as soon as possible we'll followup on the results. She will be given when necessary Lasix and K. to supplementation with CHF instructions.    Her lower extremity ultrasound was negative, she has responded well to Lasix and steroids and is back to baseline typically she has ruled out PE.  For her dyslipidemia and hypertension, LDL was slightly over 100, for hypertension she is on lisinopril and HCTZ, vision which will be continued, will add beta blocker low dose which will also help with her CHF.  She should is remote history of CVA, will defer to PCP to place her on long-term baby aspirin, please also repeat lipid panel in a week.  Lab Results  Component Value Date   CHOL 192 12/09/2012   HDL 72 12/09/2012   LDLCALC 108* 12/09/2012   TRIG 59 12/09/2012   CHOLHDL 2.7 12/09/2012     Today   Subjective:   Gabriela Davis today has no headache,no chest abdominal pain,no new weakness tingling or numbness, feels much better wants to go home today.   Objective:   Blood pressure 125/51, pulse 84, temperature 98.1 F (36.7 C), temperature source Oral, resp. rate 19, height 5' 6.5" (1.689 m), weight 79.833 kg (176 lb), SpO2 94.00%.   Intake/Output Summary (Last 24 hours) at 12/09/12 1452 Last data filed at 12/09/12 1418   Gross per 24 hour  Intake    447 ml  Output   1050 ml  Net   -603 ml    Exam Awake Alert, Oriented *3, No new F.N deficits, Normal affect [place].AT,PERRAL Supple Neck,No JVD, No cervical lymphadenopathy appriciated.  Symmetrical Chest wall movement, Good air movement bilaterally, CTAB RRR,No Gallops,Rubs or new Murmurs, No Parasternal Heave +ve B.Sounds, Abd Soft, Non tender, No organomegaly appriciated, No rebound -guarding or rigidity. No Cyanosis, Clubbing or edema, No new Rash or bruise  Data Review   Major procedures and Radiology Reports - PLEASE review detailed and final reports for all details in brief -   Echo Ef 45-50%, grade 2 diastolic CHF- see full report.   Dg Chest 2 View  12/08/2012  *RADIOLOGY REPORT*  Clinical Data: Shortness of breath.  CHEST - 2 VIEW  Comparison: 11/25/2007.  Findings: Trachea is midline.  Heart is enlarged.  There is diffuse interstitial prominence and indistinctness with bilateral pleural effusions.  Calcified granuloma is seen in the left upper lobe.  IMPRESSION: Mild congestive heart failure.   Original Report Authenticated By: Leanna Battles, M.D.     Micro Results     CBC w Diff: Lab Results  Component Value Date   WBC 4.8 12/08/2012   HGB 14.0 12/08/2012   HCT 40.3 12/08/2012   PLT 243 12/08/2012    CMP: Lab Results  Component Value Date   NA 135 12/09/2012   K 4.2 12/09/2012   CL 94* 12/09/2012   CO2 31 12/09/2012   BUN 15 12/09/2012   CREATININE 0.76 12/09/2012  .   Discharge Instructions     Follow with Primary MD Carollee Herter, MD in 7 days , following a echogram results.  Get CBC, CMP, Lipid Panel checked 7 days by Primary MD and again as instructed by your Primary MD. Get a 2 view Chest X ray done next visit .  Get Medicines reviewed and adjusted.  Please request your Prim.MD to go over all Hospital Tests and Procedure/Radiological results at the follow up, please get all Hospital records sent to your Prim MD by  signing hospital release before you go home.  Activity: As tolerated with Full fall precautions use walker/cane & assistance as needed   Diet:  Heart Healthy,  Fluid restriction 1.8 lit/day, Aspiration precautions.  For Heart failure patients - Check your Weight same time everyday, if you gain over 2 pounds, or you develop in leg swelling, experience more shortness of breath or chest pain, call your Primary MD immediately. Follow Cardiac Low Salt Diet and 1.8 lit/day fluid restriction.  Disposition Home   If you experience worsening of your admission symptoms, develop  shortness of breath, life threatening emergency, suicidal or homicidal thoughts you must seek medical attention immediately by calling 911 or calling your MD immediately  if symptoms less severe.  You Must read complete instructions/literature along with all the possible adverse reactions/side effects for all the Medicines you take and that have been prescribed to you. Take any new Medicines after you have completely understood and accpet all the possible adverse reactions/side effects.   Do not drive and provide baby sitting services if your were admitted for syncope or siezures until you have seen by Primary MD or a Neurologist and advised to do so again.  Do not drive when taking Pain medications.    Do not take more than prescribed Pain, Sleep and Anxiety Medications  Special Instructions: If you have smoked or chewed Tobacco  in the last 2 yrs please stop smoking, stop any regular Alcohol  and or any Recreational drug use.  Wear Seat belts while driving.  Follow-up Information    Follow up with Carollee Herter, MD. Schedule an appointment as soon as possible for a visit in 1 week.   Contact information:   [address] [place] [address] [phone]       Follow up with Arvilla Meres, MD. Schedule an appointment as soon as possible for a visit in 1 week.   Contact information:   [address] [place] [address] [phone]            Discharge Medications     Medication List     As of 12/09/2012  2:52 PM    START taking these medications         furosemide 40 MG tablet   Commonly known as: LASIX   Take 1 tablet (40 mg total) by mouth daily as needed (If you gain more than 2 pounds in 24 hours or develop swelling in her legs).      levofloxacin 500 MG tablet   Commonly known as: LEVAQUIN   Take 1 tablet (500 mg total) by mouth daily.      metoprolol 50 MG tablet   Commonly known as: LOPRESSOR   Take 0.5 tablets (25 mg total) by mouth 2 (two) times daily.      potassium chloride 10 MEQ tablet   Commonly known as: K-DUR   Take 2 tablets (20 mEq total) by mouth daily as needed (take it the day you take lasix).      CONTINUE taking these medications         albuterol 108 (90 BASE) MCG/ACT inhaler   Commonly known as: PROVENTIL HFA;VENTOLIN HFA   Inhale 2 puffs into the lungs every 6 (six) hours as needed for wheezing.      lisinopril-hydrochlorothiazide 20-12.5 MG per tablet   Commonly known as: PRINZIDE,ZESTORETIC   Take 1 tablet by mouth daily.      naproxen sodium 220 MG tablet   Commonly known as: ANAPROX          Where to get your medications    These are the prescriptions that you need to pick up. We sent them to a specific pharmacy, so you will need to go there to get them.   [facility] [address] [place], [place] - [address]    [address] [place]  [address]    Phone: [phone]        albuterol 108 (90 BASE) MCG/ACT inhaler   furosemide 40 MG tablet   levofloxacin  500 MG tablet   metoprolol 50 MG tablet   potassium chloride 10 MEQ tablet               Total Time in preparing paper work, data evaluation and todays exam - 35 minutes  Leroy Sea M.D on 12/09/2012 at 2:52 PM  [facility] Office  [phone]

## 2012-12-09 NOTE — Progress Notes (Signed)
  Echocardiogram 2D Echocardiogram has been performed.  Gabriela Davis 12/09/2012, 1:15 PM

## 2012-12-09 NOTE — Progress Notes (Signed)
Utilization Review Completed Montine Hight J. Maliq Pilley, RN, BSN, NCM [phone]  

## 2012-12-10 NOTE — Progress Notes (Signed)
Discharge concurrent Utilization Review Completed Nyazia Canevari J. Lucretia Roers, RN, BSN, [employer] [phone]

## 2012-12-19 ENCOUNTER — Telehealth: Payer: Self-pay | Admitting: Family Medicine

## 2012-12-19 NOTE — Telephone Encounter (Signed)
I called and left message to callback. CLOS

## 2012-12-19 NOTE — Telephone Encounter (Signed)
Message copied by Janeice Robinson on Fri Dec 19, 2012  5:00 PM ------      Message from: [place], [place]      Created: Fri Dec 19, 2012  8:13 AM       Call and check up on her.  I was curious how she is doing.  I believe she is out of the hospital, but wanted to check on how she is feeling, and she is due for f/u with both [place] and Dr. Clarise Cruz I believe.              FYI - we sent her to the ED where she was admitted for heart failure

## 2012-12-22 NOTE — Telephone Encounter (Signed)
Patient states that she is feeling a lot better and she will call back to schedule her follow appointment. She said she has an appointment to see the cardiologists on 12/23/12. CLS

## 2012-12-23 ENCOUNTER — Encounter: Payer: Self-pay | Admitting: Cardiology

## 2012-12-23 ENCOUNTER — Ambulatory Visit (INDEPENDENT_AMBULATORY_CARE_PROVIDER_SITE_OTHER): Payer: Medicare HMO | Admitting: Cardiology

## 2012-12-23 VITALS — BP 136/72 | HR 62 | Ht 67.0 in | Wt 179.0 lb

## 2012-12-23 DIAGNOSIS — J4489 Other specified chronic obstructive pulmonary disease: Secondary | ICD-10-CM

## 2012-12-23 DIAGNOSIS — I428 Other cardiomyopathies: Secondary | ICD-10-CM

## 2012-12-23 DIAGNOSIS — I10 Essential (primary) hypertension: Secondary | ICD-10-CM

## 2012-12-23 DIAGNOSIS — J449 Chronic obstructive pulmonary disease, unspecified: Secondary | ICD-10-CM | POA: Insufficient documentation

## 2012-12-23 DIAGNOSIS — F17200 Nicotine dependence, unspecified, uncomplicated: Secondary | ICD-10-CM

## 2012-12-23 NOTE — Patient Instructions (Addendum)
The current medical regimen is effective;  continue present plan and medications.  Your physician has requested that you have a lexiscan myoview. For further information please visit [email or web address] Please follow instruction sheet, as given.  Follow up with Dr Antoine Poche in 4 months.

## 2012-12-23 NOTE — Progress Notes (Signed)
HPI The patient presents for evaluation of cardiomyopathy. She was recently hospitalized with slowly progressive dyspnea and lower extremity swelling. She was to have a mildly elevated pro BNP and an ejection fraction of 45-50% on echo. She was managed with diuresis. In retrospect she had not been watching her blood pressure and had actually run out of some of her medicines. She is now referred here as a new patient. Since she was in the hospital she has done well. She is watching her salt. She takes her diuretics as needed. She has had no further shortness of breath, PND or orthopnea. She has had no palpitations, presyncope or syncope. She denies any chest pressure, neck or arm discomfort.  Allergies  Allergen Reactions  . Cephalexin Anaphylaxis  . Morphine And Related Nausea And Vomiting  . Penicillins Swelling    SEVERE    Current Outpatient Prescriptions  Medication Sig Dispense Refill  . albuterol (PROVENTIL HFA;VENTOLIN HFA) 108 (90 BASE) MCG/ACT inhaler Inhale 2 puffs into the lungs every 6 (six) hours as needed for wheezing.  1 Inhaler  0  . furosemide (LASIX) 40 MG tablet Take 1 tablet (40 mg total) by mouth daily as needed (If you gain more than 2 pounds in 24 hours or develop swelling in her legs).  15 tablet  0  . lisinopril-hydrochlorothiazide (PRINZIDE,ZESTORETIC) 20-12.5 MG per tablet Take 1 tablet by mouth daily.  30 tablet  prn  . metoprolol (LOPRESSOR) 50 MG tablet Take 0.5 tablets (25 mg total) by mouth 2 (two) times daily.  60 tablet  0  . naproxen sodium (ANAPROX) 220 MG tablet Take 220 mg by mouth 2 (two) times daily with a meal.      . potassium chloride (K-DUR) 10 MEQ tablet Take 2 tablets (20 mEq total) by mouth daily as needed (take it the day you take lasix).  30 tablet  0    Past Medical History  Diagnosis Date  . Hypertension   . COPD (chronic obstructive pulmonary disease)   . Dyslipidemia   . History of CVA (cerebrovascular accident)   . Tobacco use  disorder     Past Surgical History  Procedure Date  . Laminectomy     L4-5  . Cholecystectomy   . Knee surgery     x 2-3, L knee  . Abdominal hysterectomy     No family history on file.  History   Social History  . Marital Status: Married    Spouse Name: N/A    Number of Children: N/A  . Years of Education: N/A   Occupational History  . Not on file.   Social History Main Topics  . Smoking status: Current Every Day Smoker -- 0.2 packs/day for 40 years    Types: Cigarettes  . Smokeless tobacco: Never Used     Comment: cutting back, now smoking 6 cigarettes daily  . Alcohol Use: 1.2 oz/week    2 Cans of beer per week     Comment: 2-3 beers every 2 weeks  . Drug Use: No  . Sexually Active: Yes    Birth Control/ Protection: Post-menopausal   Other Topics Concern  . Not on file   Social History Narrative  . No narrative on file    ROS:  Positive for joint pains, back pain.  Otherwise as stated in the HPI and negative for all other systems.  PHYSICAL EXAM BP 136/72  Pulse 62  Ht 5\' 7"  (1.702 m)  Wt 179 lb (81.194 kg)  BMI 28.04 kg/m2 GENERAL:  Well appearing HEENT:  Pupils equal round and reactive, fundi not visualized, oral mucosa unremarkable NECK:  No jugular venous distention, waveform within normal limits, carotid upstroke brisk and symmetric, no bruits, no thyromegaly LYMPHATICS:  No cervical, inguinal adenopathy LUNGS:  Clear to auscultation bilaterally BACK:  No CVA tenderness CHEST:  Unremarkable HEART:  PMI not displaced or sustained,S1 and S2 within normal limits, no S3, positive S4, no clicks, no rubs, no murmurs ABD:  Flat, positive bowel sounds normal in frequency in pitch, no bruits, no rebound, no guarding, no midline pulsatile mass, no hepatomegaly, no splenomegaly EXT:  2 plus pulses throughout, no edema, no cyanosis no clubbing SKIN:  No rashes no nodules NEURO:  Cranial nerves II through XII grossly intact, motor grossly intact  throughout PSYCH:  Cognitively intact, oriented to person place and time  EKG:  Sinus rhythm, left axis deviation, left anterior fascicular block, poor anterior R wave progression  ASSESSMENT AND PLAN  Cardiomyopathy - I suspect this may be related to hypertension area however, need to exclude obstructive coronary disease. I will plan stress test. She wouldn't be able to walk on a treadmill because of knee pain. Therefore, she will have a [employer].  HTN - Her blood pressure is well controlled. She will continue the meds as listed.  Tobacco abuse - She plans on quitting Monday!

## 2012-12-30 ENCOUNTER — Encounter (HOSPITAL_COMMUNITY): Payer: Medicare HMO

## 2013-01-07 ENCOUNTER — Encounter (HOSPITAL_COMMUNITY): Payer: Medicare HMO

## 2013-01-15 ENCOUNTER — Ambulatory Visit (HOSPITAL_COMMUNITY): Payer: Medicare HMO | Attending: Cardiology | Admitting: Radiology

## 2013-01-15 ENCOUNTER — Encounter (HOSPITAL_COMMUNITY): Payer: Self-pay | Admitting: Radiology

## 2013-01-15 VITALS — BP 147/77 | Ht 67.0 in | Wt 172.0 lb

## 2013-01-15 DIAGNOSIS — J4489 Other specified chronic obstructive pulmonary disease: Secondary | ICD-10-CM | POA: Insufficient documentation

## 2013-01-15 DIAGNOSIS — R0789 Other chest pain: Secondary | ICD-10-CM | POA: Insufficient documentation

## 2013-01-15 DIAGNOSIS — J449 Chronic obstructive pulmonary disease, unspecified: Secondary | ICD-10-CM | POA: Insufficient documentation

## 2013-01-15 DIAGNOSIS — I10 Essential (primary) hypertension: Secondary | ICD-10-CM | POA: Insufficient documentation

## 2013-01-15 DIAGNOSIS — R0602 Shortness of breath: Secondary | ICD-10-CM | POA: Insufficient documentation

## 2013-01-15 DIAGNOSIS — Z8673 Personal history of transient ischemic attack (TIA), and cerebral infarction without residual deficits: Secondary | ICD-10-CM | POA: Insufficient documentation

## 2013-01-15 DIAGNOSIS — I428 Other cardiomyopathies: Secondary | ICD-10-CM

## 2013-01-15 DIAGNOSIS — Z79899 Other long term (current) drug therapy: Secondary | ICD-10-CM | POA: Insufficient documentation

## 2013-01-15 DIAGNOSIS — R079 Chest pain, unspecified: Secondary | ICD-10-CM

## 2013-01-15 DIAGNOSIS — E785 Hyperlipidemia, unspecified: Secondary | ICD-10-CM | POA: Insufficient documentation

## 2013-01-15 MED ORDER — TECHNETIUM TC 99M SESTAMIBI GENERIC - CARDIOLITE
33.0000 | Freq: Once | INTRAVENOUS | Status: AC | PRN
  Administered 2013-01-15: 33 via INTRAVENOUS

## 2013-01-15 MED ORDER — REGADENOSON 0.4 MG/5ML IV SOLN
0.4000 mg | Freq: Once | INTRAVENOUS | Status: AC
  Administered 2013-01-15: 0.4 mg via INTRAVENOUS

## 2013-01-15 MED ORDER — TECHNETIUM TC 99M SESTAMIBI GENERIC - CARDIOLITE
11.0000 | Freq: Once | INTRAVENOUS | Status: AC | PRN
  Administered 2013-01-15: 11 via INTRAVENOUS

## 2013-01-15 NOTE — Progress Notes (Signed)
[facility] SITE 3 NUCLEAR MED [address] [place], [place] [address] [phone]    Cardiology Nuclear Med Study  Gabriela Davis is a 72 y.o. female     MRN : [id number]     DOB: 09/08/1941  Procedure Date: 01/15/2013  Nuclear Med Background Indication for Stress Test:  Evaluation for Ischemia History:  1/14 Echo EF 45 -50% Cardiac Risk Factors: CVA, Hypertension, Lipids and Smoker  Symptoms:  Chest Pain and SOB   Nuclear Pre-Procedure Caffeine/Decaff Intake:  None NPO After: 7:00pm   Lungs:  clear O2 Sat: 95% on room air. IV 0.9% NS with Angio Cath:  20g  IV Site: R Forearm  IV Started by:  Stanton Kidney, EMT-P  Chest Size (in):  42 Cup Size: DD  Height: 5\' 7"  (1.702 m)  Weight:  172 lb (78.019 kg)  BMI:  Body mass index is 26.93 kg/(m^2). Tech Comments:  Metoprolol held > 12 hours, per patient.    Nuclear Med Study 1 or 2 day study: 1 day  Stress Test Type:  Eugenie Birks  Reading MD: Marca Ancona, MD  Order Authorizing Provider:  Rollene Rotunda, MD  Resting Radionuclide: Technetium 9m Sestamibi  Resting Radionuclide Dose: 11.0 mCi   Stress Radionuclide:  Technetium 23m Sestamibi  Stress Radionuclide Dose: 32.9 mCi           Stress Protocol Rest HR: 71 Stress HR: 102  Rest BP: 147/77 Stress BP: 145/73  Exercise Time (min): n/a METS: n/a   Predicted Max HR: 150 bpm % Max HR: 68 bpm Rate Pressure Product: 21308   Dose of Adenosine (mg):  n/a Dose of Lexiscan: 0.4 mg  Dose of Atropine (mg): n/a Dose of Dobutamine: n/a mcg/kg/min (at max HR)  Stress Test Technologist: Bonnita Levan, RN  Nuclear Technologist:  Domenic Polite, CNMT     Rest Procedure:  Myocardial perfusion imaging was performed at rest 45 minutes following the intravenous administration of Technetium 6m Sestamibi. Rest ECG: Normal  Stress Procedure:  The patient received IV Lexiscan 0.4 mg over 15-seconds.  Technetium 28m Sestamibi injected at 30-seconds.  Quantitative spect images were  obtained after a 45 minute delay. Stress ECG: No significant ST segment change suggestive of ischemia.  QPS Raw Data Images:  Normal; no motion artifact; normal heart/lung ratio. Stress Images:  Small, mild apical perfusion defect. Rest Images:  Small, mild apical perfusion defect. Subtraction (SDS):  Fixed small, mild apical perfusion defect.  Transient Ischemic Dilatation (Normal <1.22):  1.05 Lung/Heart Ratio (Normal <0.45):  0.33  Quantitative Gated Spect Images QGS EDV:  n/a QGS ESV:  n/a  Impression Exercise Capacity:  Lexiscan with no exercise. BP Response:  Normal blood pressure response. Clinical Symptoms:  No symptoms.  ECG Impression:  No ischemic changes but PVCs were noted.  Comparison with Prior Nuclear Study: No images to compare  Overall Impression:  Low risk stress nuclear study.  Fixed small, mild apical perfusion defect.  Given significant breast shadowing on planar images, I suspect that this is attenuation artifact.   LV Ejection Fraction: Study not gated.  LV Wall Motion:  Study not gated because of PVCs.   Marca Ancona 01/15/2013

## 2013-01-16 ENCOUNTER — Telehealth: Payer: Self-pay | Admitting: Cardiology

## 2013-01-16 NOTE — Telephone Encounter (Signed)
Spoke with patient and gave her results of stress test. She will follow up with Dr.Hochrein in June.

## 2013-01-16 NOTE — Telephone Encounter (Signed)
New Problem:    Patient returned your call regarding her stress test.  Please call back.

## 2013-03-05 ENCOUNTER — Ambulatory Visit: Payer: Self-pay | Admitting: Family Medicine

## 2013-04-22 ENCOUNTER — Ambulatory Visit: Payer: Medicare HMO | Admitting: Cardiology

## 2013-05-14 ENCOUNTER — Encounter: Payer: Self-pay | Admitting: Cardiology

## 2013-05-26 ENCOUNTER — Other Ambulatory Visit: Payer: Self-pay | Admitting: Family Medicine

## 2013-07-08 ENCOUNTER — Telehealth: Payer: Self-pay | Admitting: Family Medicine

## 2013-07-08 MED ORDER — LISINOPRIL-HYDROCHLOROTHIAZIDE 20-12.5 MG PO TABS: 1.0000 | ORAL_TABLET | Freq: Every day | ORAL | Status: DC

## 2013-07-08 NOTE — Telephone Encounter (Signed)
Pt informed she needs appt before next refill

## 2013-07-27 ENCOUNTER — Encounter: Payer: Self-pay | Admitting: Family Medicine

## 2013-07-27 ENCOUNTER — Other Ambulatory Visit: Payer: Self-pay | Admitting: Family Medicine

## 2013-07-27 ENCOUNTER — Ambulatory Visit (INDEPENDENT_AMBULATORY_CARE_PROVIDER_SITE_OTHER): Payer: Medicare PPO | Admitting: Family Medicine

## 2013-07-27 VITALS — BP 130/90 | HR 99 | Wt 169.0 lb

## 2013-07-27 DIAGNOSIS — Z79899 Other long term (current) drug therapy: Secondary | ICD-10-CM

## 2013-07-27 DIAGNOSIS — Z720 Tobacco use: Secondary | ICD-10-CM

## 2013-07-27 DIAGNOSIS — I10 Essential (primary) hypertension: Secondary | ICD-10-CM

## 2013-07-27 DIAGNOSIS — F17200 Nicotine dependence, unspecified, uncomplicated: Secondary | ICD-10-CM

## 2013-07-27 DIAGNOSIS — I491 Atrial premature depolarization: Secondary | ICD-10-CM

## 2013-07-27 DIAGNOSIS — I499 Cardiac arrhythmia, unspecified: Secondary | ICD-10-CM

## 2013-07-27 DIAGNOSIS — M1990 Unspecified osteoarthritis, unspecified site: Secondary | ICD-10-CM

## 2013-07-27 LAB — LIPID PANEL
Cholesterol: 192 mg/dL (ref 0–200)
HDL: 69 mg/dL (ref >39)
Total CHOL/HDL Ratio: 2.8 Ratio
Triglycerides: 98 mg/dL (ref <150)
VLDL: 20 mg/dL (ref 0–40)

## 2013-07-27 LAB — COMPREHENSIVE METABOLIC PANEL
ALT: 18 U/L (ref 0–35)
BUN: 10 mg/dL (ref 6–23)
CO2: 31 mEq/L (ref 19–32)
Calcium: 9.3 mg/dL (ref 8.4–10.5)
Creat: 0.76 mg/dL (ref 0.50–1.10)
Glucose, Bld: 131 mg/dL — ABNORMAL HIGH (ref 70–99)
Total Bilirubin: 0.7 mg/dL (ref 0.3–1.2)

## 2013-07-27 LAB — CBC WITH DIFFERENTIAL/PLATELET
Eosinophils Absolute: 0.2 10*3/uL (ref 0.0–0.7)
Eosinophils Relative: 5 % (ref 0–5)
HCT: 44.8 % (ref 36.0–46.0)
Lymphs Abs: 1.1 10*3/uL (ref 0.7–4.0)
MCH: 34 pg (ref 26.0–34.0)
MCV: 95.3 fL (ref 78.0–100.0)
Monocytes Absolute: 0.3 10*3/uL (ref 0.1–1.0)
Platelets: 248 10*3/uL (ref 150–400)
RBC: 4.7 MIL/uL (ref 3.87–5.11)

## 2013-07-27 NOTE — Progress Notes (Signed)
  Subjective:    Patient ID: Gabriela Davis, female    DOB: 1941-04-23, 72 y.o.   MRN: [id number]  HPI He is here for medication check. Presently she is only taking her blood pressure medication. Review of the record indicates no evidence of COPD. She was using Proventil but mainly for URI symptoms. She still smokes and is not yet ready to quit entirely. Review his record indicates she was admitted in January evaluated for cardiac disease. He was followed up as an outpatient with Dr. Berna Bue who did a stress Myoview which was essentially negative . She does complain of intermittent bilateral knee pain.   Review of Systems     Objective:   Physical Exam alert and in no distress. Tympanic membranes and canals are normal. Throat is clear. Tonsils are normal. Neck is supple without adenopathy or thyromegaly. Cardiac exam shows an irregular sinus rhythm without murmurs or gallops. Lungs are clear to auscultation. EKG shows PACs.       Assessment & Plan:  HTN (hypertension)  Tobacco abuse  Osteoarthritis  Arrhythmia - Plan: EKG 12-Lead, CBC with Differential, Comprehensive metabolic panel, Lipid panel  PAC (premature atrial contraction) - Plan: CBC with Differential, Comprehensive metabolic panel, Lipid panel  Encounter for long-term (current) use of other medications - Plan: CBC with Differential, Comprehensive metabolic panel, Lipid panel  She is not quite ready to quit smoking entirely. Her arthritis is giving her very little difficulty. She is not using the inhaler or the NSAID. I reassured her that the PACs were nothing to be concerned about.

## 2013-07-29 ENCOUNTER — Other Ambulatory Visit: Payer: Self-pay

## 2013-07-29 DIAGNOSIS — R899 Unspecified abnormal finding in specimens from other organs, systems and tissues: Secondary | ICD-10-CM

## 2013-07-29 NOTE — Progress Notes (Signed)
Quick Note:  Left message on pt cell word for word have her increase salt in her diet. Have her return here in one week for recheck. N her sodium and potassium were low and for her to call so we can set her up for nurse appointment ______

## 2013-08-04 ENCOUNTER — Other Ambulatory Visit: Payer: Medicare PPO

## 2013-08-04 DIAGNOSIS — R899 Unspecified abnormal finding in specimens from other organs, systems and tissues: Secondary | ICD-10-CM

## 2013-08-04 LAB — COMPREHENSIVE METABOLIC PANEL
BUN: 12 mg/dL (ref 6–23)
CO2: 31 mEq/L (ref 19–32)
Calcium: 9.5 mg/dL (ref 8.4–10.5)
Chloride: 95 mEq/L — ABNORMAL LOW (ref 96–112)
Creat: 0.94 mg/dL (ref 0.50–1.10)
Glucose, Bld: 145 mg/dL — ABNORMAL HIGH (ref 70–99)
Total Bilirubin: 0.8 mg/dL (ref 0.3–1.2)

## 2013-08-05 ENCOUNTER — Encounter: Payer: Self-pay | Admitting: Internal Medicine

## 2013-08-10 ENCOUNTER — Telehealth: Payer: Self-pay | Admitting: Internal Medicine

## 2013-08-10 MED ORDER — LISINOPRIL-HYDROCHLOROTHIAZIDE 20-12.5 MG PO TABS: 1.0000 | ORAL_TABLET | Freq: Every day | ORAL | Status: DC

## 2013-08-10 NOTE — Telephone Encounter (Signed)
Pt needed a refill on lisinopril-hctz sent to pharmacy. It was not done when she was in a few weeks ago

## 2013-08-17 ENCOUNTER — Ambulatory Visit (INDEPENDENT_AMBULATORY_CARE_PROVIDER_SITE_OTHER): Payer: Medicare PPO | Admitting: Family Medicine

## 2013-08-17 VITALS — BP 120/80 | HR 80 | Temp 98.7°F

## 2013-08-17 DIAGNOSIS — H9209 Otalgia, unspecified ear: Secondary | ICD-10-CM

## 2013-08-17 DIAGNOSIS — Z23 Encounter for immunization: Secondary | ICD-10-CM

## 2013-08-17 DIAGNOSIS — H9201 Otalgia, right ear: Secondary | ICD-10-CM

## 2013-08-17 NOTE — Patient Instructions (Signed)
Take 2 Aleve twice per day and let's see how you do over the next several days

## 2013-08-17 NOTE — Progress Notes (Signed)
  Subjective:    Patient ID: Gabriela Davis, female    DOB: 1941-06-14, 72 y.o.   MRN: [id number]  HPI She has a several hour history of right earache but no fever, chills, sore throat.   Review of Systems     Objective:   Physical Exam alert and in no distress. Tympanic membranes and canals are normal. Throat is clear. Tonsils are normal. Neck is supple without adenopathy or thyromegaly. Cardiac exam shows a regular sinus rhythm without murmurs or gallops. Lungs are clear to auscultation.        Assessment & Plan:  Otalgia, right  Need for prophylactic vaccination and inoculation against influenza - Plan: Flu Vaccine QUAD 36+ mos IM  echo supportive care for the ear pain. Flu shot given with risks and benefits discussed.

## 2013-09-17 ENCOUNTER — Encounter: Payer: Self-pay | Admitting: Internal Medicine

## 2013-11-16 ENCOUNTER — Other Ambulatory Visit: Payer: Self-pay

## 2013-11-16 ENCOUNTER — Telehealth: Payer: Self-pay | Admitting: Family Medicine

## 2013-11-16 MED ORDER — LISINOPRIL-HYDROCHLOROTHIAZIDE 20-12.5 MG PO TABS: 1.0000 | ORAL_TABLET | Freq: Every day | ORAL | Status: DC

## 2013-11-16 NOTE — Telephone Encounter (Signed)
Pt called for refill of Lisinopril to [facility] on [address].    This is a diff pharm

## 2013-11-16 NOTE — Telephone Encounter (Signed)
SENT IN B/P MED TO NEW PHARMACY

## 2014-02-08 ENCOUNTER — Ambulatory Visit
Admission: RE | Admit: 2014-02-08 | Discharge: 2014-02-08 | Disposition: A | Discharge status: Home / Self Care | Payer: Commercial Managed Care - HMO | Source: Ambulatory Visit | Attending: Family Medicine | Admitting: Family Medicine

## 2014-02-08 ENCOUNTER — Encounter: Payer: Self-pay | Admitting: Family Medicine

## 2014-02-08 ENCOUNTER — Ambulatory Visit (INDEPENDENT_AMBULATORY_CARE_PROVIDER_SITE_OTHER): Payer: Medicare PPO | Admitting: Family Medicine

## 2014-02-08 VITALS — BP 110/68 | HR 76 | Wt 164.0 lb

## 2014-02-08 DIAGNOSIS — M25461 Effusion, right knee: Secondary | ICD-10-CM

## 2014-02-08 DIAGNOSIS — M25469 Effusion, unspecified knee: Secondary | ICD-10-CM

## 2014-02-08 NOTE — Progress Notes (Signed)
   Subjective:    Patient ID: Gabriela Davis, female    DOB: 1941/08/20, 73 y.o.   MRN: [id number]  HPI Approximately 2 weeks ago she was awakened by right knee pain. No history of recent injury. She did have some pain with walking no popping, locking or grinding. She also felt some posterior knee pain as well as pain distal to that. She notes it when she gets up the pain is worse today she continues to walk the pain does diminish. She has taken Aleve but intermittently with no relief . No other joints are involved. She is up-to-date on mammogram and colonoscopy.   Review of Systems     Objective:   Physical Exam Right knee exam does show an effusion. There is slight tenderness to palpation but not necessarily joint line. Ligaments intact. Negative anterior drawer. McMurray's testing was uncomfortable bilaterally. No joint swelling or pain in the ankles left knee, elbows or wrists.     Assessment & Plan:  Knee effusion, right - Plan: DG Knee Complete 4 Views Right

## 2014-02-11 ENCOUNTER — Ambulatory Visit (INDEPENDENT_AMBULATORY_CARE_PROVIDER_SITE_OTHER): Payer: Commercial Managed Care - HMO | Admitting: Family Medicine

## 2014-02-11 ENCOUNTER — Encounter: Payer: Self-pay | Admitting: Family Medicine

## 2014-02-11 VITALS — BP 130/70 | HR 76 | Wt 164.0 lb

## 2014-02-11 DIAGNOSIS — M1990 Unspecified osteoarthritis, unspecified site: Secondary | ICD-10-CM

## 2014-02-11 DIAGNOSIS — M1120 Other chondrocalcinosis, unspecified site: Secondary | ICD-10-CM | POA: Insufficient documentation

## 2014-02-11 DIAGNOSIS — M25569 Pain in unspecified knee: Secondary | ICD-10-CM

## 2014-02-11 MED ORDER — LIDOCAINE HCL 1 % IJ SOLN
2.0000 mL | Freq: Once | INTRAMUSCULAR | Status: AC
  Administered 2014-02-11: 2 mL via INTRADERMAL

## 2014-02-11 MED ORDER — TRIAMCINOLONE ACETONIDE 40 MG/ML IJ SUSP
40.0000 mg | Freq: Once | INTRAMUSCULAR | Status: AC
  Administered 2014-02-11: 40 mg via INTRAMUSCULAR

## 2014-02-11 NOTE — Progress Notes (Signed)
   Subjective:    Patient ID: Gabriela Davis, female    DOB: 01/29/1941, 73 y.o.   MRN: [id number]  HPI She is here for recheck on her knee pain. Recent x-rays did show some degenerative changes as well as chondrocalcinosis. She has not obtained much relief with the use of Aleve but has had some slight GI distress.   Review of Systems     Objective:   Physical Exam Alert and in no distress. Motion of the knee did cause pain.       Assessment & Plan:  Knee pain - Plan: triamcinolone acetonide (KENALOG-40) injection 40 mg, lidocaine (XYLOCAINE) 1 % (with pres) injection 2 mL  Degenerative joint disease  Chondrocalcinosis  I discussed options with her including a different pain medication versus injection. She opted to have an injection. 40 mg of Kenalog and 3 cc of Xylocaine was injected into the joint space without difficulty. She did obtain some slight relief of her symptoms relatively quickly. I explained that this was temporizing and if continued difficulty, I will refer her to orthopedics.

## 2014-03-18 ENCOUNTER — Other Ambulatory Visit: Payer: Self-pay | Admitting: Family Medicine

## 2014-04-26 ENCOUNTER — Other Ambulatory Visit: Payer: Self-pay | Admitting: Family Medicine

## 2014-04-27 ENCOUNTER — Ambulatory Visit (INDEPENDENT_AMBULATORY_CARE_PROVIDER_SITE_OTHER): Payer: Medicare PPO | Admitting: Family Medicine

## 2014-04-27 ENCOUNTER — Encounter: Payer: Self-pay | Admitting: Family Medicine

## 2014-04-27 VITALS — BP 120/78 | Wt 166.0 lb

## 2014-04-27 DIAGNOSIS — M25569 Pain in unspecified knee: Secondary | ICD-10-CM

## 2014-04-27 NOTE — Progress Notes (Signed)
   Subjective:    Patient ID: Quinette Hentges, female    DOB: 23-May-1941, 73 y.o.   MRN: [id number]  HPI He is here for recheck. She states that the injection did give her relief of symptoms for approximately one week and the pain has returned. She also notes slight swelling in the knee.  Review of Systems     Objective:   Physical Exam Exam of the right knee does show a small effusion with pain on motion of the knee. Anterior drawer negative. McMurray's testing negative. The x-rays did show some degenerative changes and chondrocalcinosis.     Assessment & Plan:  Knee pain - Plan: Ambulatory referral to Orthopedic Surgery  she has essentially failed conservative care and I think orthopedic referral is appropriate.

## 2014-06-30 ENCOUNTER — Telehealth: Payer: Self-pay | Admitting: Internal Medicine

## 2014-06-30 ENCOUNTER — Other Ambulatory Visit: Payer: Self-pay

## 2014-06-30 MED ORDER — LISINOPRIL-HYDROCHLOROTHIAZIDE 20-12.5 MG PO TABS: ORAL_TABLET | ORAL | Status: DC

## 2014-06-30 NOTE — Telephone Encounter (Signed)
DONE

## 2014-06-30 NOTE — Telephone Encounter (Signed)
Refill request for lisinopril-hctz 20-12.5mg  #30 to [facility]

## 2014-08-30 ENCOUNTER — Other Ambulatory Visit: Payer: Self-pay | Admitting: Family Medicine

## 2014-09-27 ENCOUNTER — Ambulatory Visit: Payer: Medicare PPO | Admitting: Family Medicine

## 2014-09-30 ENCOUNTER — Encounter: Payer: Self-pay | Admitting: Internal Medicine

## 2014-11-19 HISTORY — PX: BREAST BIOPSY: SHX20

## 2014-11-24 DIAGNOSIS — M25561 Pain in right knee: Secondary | ICD-10-CM | POA: Diagnosis not present

## 2014-12-29 DIAGNOSIS — M545 Low back pain: Secondary | ICD-10-CM | POA: Diagnosis not present

## 2014-12-29 DIAGNOSIS — M25551 Pain in right hip: Secondary | ICD-10-CM | POA: Diagnosis not present

## 2014-12-29 DIAGNOSIS — M25561 Pain in right knee: Secondary | ICD-10-CM | POA: Diagnosis not present

## 2015-03-27 ENCOUNTER — Other Ambulatory Visit: Payer: Self-pay | Admitting: Family Medicine

## 2015-04-25 ENCOUNTER — Other Ambulatory Visit: Payer: Self-pay | Admitting: Family Medicine

## 2015-05-19 ENCOUNTER — Ambulatory Visit (INDEPENDENT_AMBULATORY_CARE_PROVIDER_SITE_OTHER): Payer: Commercial Managed Care - HMO | Admitting: Family Medicine

## 2015-05-19 ENCOUNTER — Encounter: Payer: Self-pay | Admitting: Family Medicine

## 2015-05-19 VITALS — BP 130/80 | HR 70 | Wt 177.0 lb

## 2015-05-19 DIAGNOSIS — M25462 Effusion, left knee: Secondary | ICD-10-CM

## 2015-05-19 DIAGNOSIS — L259 Unspecified contact dermatitis, unspecified cause: Secondary | ICD-10-CM

## 2015-05-19 MED ORDER — ALBUTEROL SULFATE HFA 108 (90 BASE) MCG/ACT IN AERS: 2.0000 | INHALATION_SPRAY | Freq: Four times a day (QID) | RESPIRATORY_TRACT | Status: DC | PRN

## 2015-05-19 MED ORDER — TRIAMCINOLONE ACETONIDE 0.1 % EX CREA: 1.0000 "application " | TOPICAL_CREAM | Freq: Two times a day (BID) | CUTANEOUS | Status: DC

## 2015-05-19 NOTE — Progress Notes (Signed)
   Subjective:    Patient ID: Gabriela Davis, female    DOB: 06-Aug-1941, 74 y.o.   MRN: [id number]  HPI    Review of Systems     Objective:   Physical Exam        Assessment & Plan:  She also wanted a refill on her inhaler. She has not had it filled since 2014.

## 2015-05-19 NOTE — Patient Instructions (Signed)
Use cold compresses and Benadryl at night.Contact Dermatitis Contact dermatitis is a reaction to certain substances that touch the skin. Contact dermatitis can be either irritant contact dermatitis or allergic contact dermatitis. Irritant contact dermatitis does not require previous exposure to the substance for a reaction to occur.Allergic contact dermatitis only occurs if you have been exposed to the substance before. Upon a repeat exposure, your body reacts to the substance.  CAUSES  Many substances can cause contact dermatitis. Irritant dermatitis is most commonly caused by repeated exposure to mildly irritating substances, such as:  Makeup.  Soaps.  Detergents.  Bleaches.  Acids.  Metal salts, such as nickel. Allergic contact dermatitis is most commonly caused by exposure to:  Poisonous plants.  Chemicals (deodorants, shampoos).  Jewelry.  Latex.  Neomycin in triple antibiotic cream.  Preservatives in products, including clothing. SYMPTOMS  The area of skin that is exposed may develop:  Dryness or flaking.  Redness.  Cracks.  Itching.  Pain or a burning sensation.  Blisters. With allergic contact dermatitis, there may also be swelling in areas such as the eyelids, mouth, or genitals.  DIAGNOSIS  Your caregiver can usually tell what the problem is by doing a physical exam. In cases where the cause is uncertain and an allergic contact dermatitis is suspected, a patch skin test may be performed to help determine the cause of your dermatitis. TREATMENT Treatment includes protecting the skin from further contact with the irritating substance by avoiding that substance if possible. Barrier creams, powders, and gloves may be helpful. Your caregiver may also recommend:  Steroid creams or ointments applied 2 times daily. For best results, soak the rash area in cool water for 20 minutes. Then apply the medicine. Cover the area with a plastic wrap. You can store the  steroid cream in the refrigerator for a "chilly" effect on your rash. That may decrease itching. Oral steroid medicines may be needed in more severe cases.  Antibiotics or antibacterial ointments if a skin infection is present.  Antihistamine lotion or an antihistamine taken by mouth to ease itching.  Lubricants to keep moisture in your skin.  Burow's solution to reduce redness and soreness or to dry a weeping rash. Mix one packet or tablet of solution in 2 cups cool water. Dip a clean washcloth in the mixture, wring it out a bit, and put it on the affected area. Leave the cloth in place for 30 minutes. Do this as often as possible throughout the day.  Taking several cornstarch or baking soda baths daily if the area is too large to cover with a washcloth. Harsh chemicals, such as alkalis or acids, can cause skin damage that is like a burn. You should flush your skin for 15 to 20 minutes with cold water after such an exposure. You should also seek immediate medical care after exposure. Bandages (dressings), antibiotics, and pain medicine may be needed for severely irritated skin.  HOME CARE INSTRUCTIONS  Avoid the substance that caused your reaction.  Keep the area of skin that is affected away from hot water, soap, sunlight, chemicals, acidic substances, or anything else that would irritate your skin.  Do not scratch the rash. Scratching may cause the rash to become infected.  You may take cool baths to help stop the itching.  Only take over-the-counter or prescription medicines as directed by your caregiver.  See your caregiver for follow-up care as directed to make sure your skin is healing properly. SEEK MEDICAL CARE IF:   Your  condition is not better after 3 days of treatment.  You seem to be getting worse.  You see signs of infection such as swelling, tenderness, redness, soreness, or warmth in the affected area.  You have any problems related to your medicines. Document  Released: 11/02/2000 Document Revised: 01/28/2012 Document Reviewed: 04/10/2011 [facility] Patient Information 2015 [place], [place]. This information is not intended to replace advice given to you by your health care provider. Make sure you discuss any questions you have with your health care provider.

## 2015-05-19 NOTE — Progress Notes (Signed)
   Subjective:    Patient ID: Gabriela Davis, female    DOB: 1941/10/05, 74 y.o.   MRN: [id number]  HPI She is here for evaluation of a rash present on her arms and left torso. She thinks her dog caused this.No fever, chills or sore throat. She also woke up this morning and noted swelling in the left knee. No pain, popping, locking. She does have previous history of arthroscopy but is not sure exactly what they did.   Review of Systems     Objective:   Physical Exam Erythematous scattered slightly raised lesions are noted on the left torso as well as on the left forearm. They are not linear but do appear to be contact in nature. Left knee exam does show an effusion. Negative anterior drawer. Medial and lateral collateral ligaments intact. McMurray's testing negative.No point tenderness.       Assessment & Plan:  Contact dermatitis - Plan: triamcinolone cream (KENALOG) 0.1 %  Knee effusion, left Recommend conservative care for both of these. She will use Kenalog and cool compresses as well as Benadryl at night. Recommend anti-inflammatory for the knee effusion however if she develops further symptoms, further evaluation will be needed.

## 2015-05-19 NOTE — Addendum Note (Signed)
Addended by: Denita Lung on: 05/19/2015 09:59 PM   Modules accepted: Orders

## 2015-05-25 ENCOUNTER — Other Ambulatory Visit: Payer: Self-pay | Admitting: Family Medicine

## 2015-08-24 ENCOUNTER — Encounter: Payer: Self-pay | Admitting: Family Medicine

## 2015-08-24 ENCOUNTER — Other Ambulatory Visit: Payer: Self-pay | Admitting: Family Medicine

## 2015-08-24 ENCOUNTER — Ambulatory Visit (INDEPENDENT_AMBULATORY_CARE_PROVIDER_SITE_OTHER): Payer: Commercial Managed Care - HMO | Admitting: Family Medicine

## 2015-08-24 VITALS — BP 130/80 | HR 60 | Ht 65.0 in | Wt 177.0 lb

## 2015-08-24 DIAGNOSIS — E785 Hyperlipidemia, unspecified: Secondary | ICD-10-CM | POA: Diagnosis not present

## 2015-08-24 DIAGNOSIS — Z13820 Encounter for screening for osteoporosis: Secondary | ICD-10-CM | POA: Diagnosis not present

## 2015-08-24 DIAGNOSIS — M150 Primary generalized (osteo)arthritis: Secondary | ICD-10-CM | POA: Diagnosis not present

## 2015-08-24 DIAGNOSIS — Z23 Encounter for immunization: Secondary | ICD-10-CM

## 2015-08-24 DIAGNOSIS — I10 Essential (primary) hypertension: Secondary | ICD-10-CM | POA: Diagnosis not present

## 2015-08-24 DIAGNOSIS — E2839 Other primary ovarian failure: Secondary | ICD-10-CM | POA: Diagnosis not present

## 2015-08-24 DIAGNOSIS — R748 Abnormal levels of other serum enzymes: Secondary | ICD-10-CM

## 2015-08-24 DIAGNOSIS — I429 Cardiomyopathy, unspecified: Secondary | ICD-10-CM | POA: Diagnosis not present

## 2015-08-24 DIAGNOSIS — I428 Other cardiomyopathies: Secondary | ICD-10-CM

## 2015-08-24 DIAGNOSIS — Z720 Tobacco use: Secondary | ICD-10-CM

## 2015-08-24 DIAGNOSIS — M159 Polyosteoarthritis, unspecified: Secondary | ICD-10-CM | POA: Diagnosis not present

## 2015-08-24 DIAGNOSIS — Z1231 Encounter for screening mammogram for malignant neoplasm of breast: Secondary | ICD-10-CM

## 2015-08-24 DIAGNOSIS — Z8673 Personal history of transient ischemic attack (TIA), and cerebral infarction without residual deficits: Secondary | ICD-10-CM

## 2015-08-24 DIAGNOSIS — Z1239 Encounter for other screening for malignant neoplasm of breast: Secondary | ICD-10-CM | POA: Diagnosis not present

## 2015-08-24 DIAGNOSIS — J301 Allergic rhinitis due to pollen: Secondary | ICD-10-CM

## 2015-08-24 LAB — CBC WITH DIFFERENTIAL/PLATELET
BASOS PCT: 2 % — AB (ref 0–1)
Basophils Absolute: 0.1 10*3/uL (ref 0.0–0.1)
EOS ABS: 0.2 10*3/uL (ref 0.0–0.7)
Eosinophils Relative: 4 % (ref 0–5)
HCT: 46.8 % — ABNORMAL HIGH (ref 36.0–46.0)
Hemoglobin: 15.9 g/dL — ABNORMAL HIGH (ref 12.0–15.0)
Lymphocytes Relative: 32 % (ref 12–46)
Lymphs Abs: 1.3 10*3/uL (ref 0.7–4.0)
MCH: 32.5 pg (ref 26.0–34.0)
MCHC: 34 g/dL (ref 30.0–36.0)
MCV: 95.7 fL (ref 78.0–100.0)
MONOS PCT: 8 % (ref 3–12)
MPV: 9.4 fL (ref 8.6–12.4)
Monocytes Absolute: 0.3 10*3/uL (ref 0.1–1.0)
Neutro Abs: 2.2 10*3/uL (ref 1.7–7.7)
Neutrophils Relative %: 54 % (ref 43–77)
PLATELETS: 258 10*3/uL (ref 150–400)
RBC: 4.89 MIL/uL (ref 3.87–5.11)
RDW: 13.1 % (ref 11.5–15.5)
WBC: 4 10*3/uL (ref 4.0–10.5)

## 2015-08-24 LAB — COMPREHENSIVE METABOLIC PANEL
ALT: 17 U/L (ref 6–29)
AST: 22 U/L (ref 10–35)
Albumin: 3.9 g/dL (ref 3.6–5.1)
Alkaline Phosphatase: 251 U/L — ABNORMAL HIGH (ref 33–130)
BUN: 17 mg/dL (ref 7–25)
CHLORIDE: 95 mmol/L — AB (ref 98–110)
CO2: 31 mmol/L (ref 20–31)
Calcium: 8.8 mg/dL (ref 8.6–10.4)
Creat: 0.73 mg/dL (ref 0.60–0.93)
GLUCOSE: 106 mg/dL — AB (ref 65–99)
POTASSIUM: 4.3 mmol/L (ref 3.5–5.3)
SODIUM: 135 mmol/L (ref 135–146)
Total Bilirubin: 0.6 mg/dL (ref 0.2–1.2)
Total Protein: 7 g/dL (ref 6.1–8.1)

## 2015-08-24 LAB — LIPID PANEL
CHOL/HDL RATIO: 3.2 ratio (ref <=5.0)
CHOLESTEROL: 214 mg/dL — AB (ref 125–200)
HDL: 67 mg/dL (ref >=46)
LDL Cholesterol: 126 mg/dL (ref <130)
Triglycerides: 103 mg/dL (ref <150)
VLDL: 21 mg/dL (ref <30)

## 2015-08-24 MED ORDER — LISINOPRIL-HYDROCHLOROTHIAZIDE 20-12.5 MG PO TABS: 1.0000 | ORAL_TABLET | Freq: Every day | ORAL | Status: DC

## 2015-08-24 NOTE — Progress Notes (Signed)
   Subjective:    Patient ID: Gabriela Davis, female    DOB: February 01, 1941, 74 y.o.   MRN: [id number]  HPI She is here for an evaluation. She continues on her lisinopril and is having no difficulty with this. She continues to smoke and is not interested in quitting. She does have a previous history of CVA and presently is not taking aspirin. She also has a history of cardiomyopathy. Hasn't really she is having no chest pain, shortness of breath, DOE or PND. She does have underlying allergies with sneezing, itchy watery eyes, rhinorrhea. She is using Zyrtec with good results. She does walk regularly by taking her dogs out. She has not had a recent mammogram. She has not had a DEXA scan. She has not been screened for colonoscopy in over 10 years. Family history, social history, health maintenance and immunizations were reviewed.   Review of Systems  All other systems reviewed and are negative.      Objective:   Physical Exam Alert and in no distress. Tympanic membranes and canals are normal. Pharyngeal area is normal. Neck is supple without adenopathy or thyromegaly. Cardiac exam shows a regular sinus rhythm without murmurs or gallops. Lungs are clear to auscultation.        Assessment & Plan:  HLD (hyperlipidemia) - Plan: Lipid panel  Essential hypertension - Plan: lisinopril-hydrochlorothiazide (PRINZIDE,ZESTORETIC) 20-12.5 MG tablet, CBC with Differential/Platelet, Comprehensive metabolic panel  Tobacco abuse  Primary osteoarthritis involving multiple joints - Plan: CBC with Differential/Platelet, Comprehensive metabolic panel  History of CVA (cerebrovascular accident)  Cardiomyopathy, nonischemic ([place]) - Plan: CBC with Differential/Platelet, Comprehensive metabolic panel, Lipid panel  Screening for osteoporosis - Plan: CANCELED: HM DEXA SCAN  Allergic rhinitis due to pollen  Screening for breast cancer - Plan: CANCELED: MM DIGITAL SCREENING BILATERAL, CANCELED: MM SCREENING  BREAST TOMO BILATERAL  Need for prophylactic vaccination and inoculation against influenza - Plan: Flu vaccine HIGH DOSE PF (Fluzone High dose)  Need for prophylactic vaccination against Streptococcus pneumoniae (pneumococcus) - Plan: Pneumococcal conjugate vaccine 13-valent  Estrogen deficiency - Plan: DG Bone Density  since she's not having any cardiac related symptoms, further intervention I don't think is needed. Did recommend she use a baby aspirin every day. She will continue on her allergy therapy. Her immunizations will be updated. Cologard was also added to further evaluate for colon cancer. Over 45 minutes, greater than 50% spent in coordination of care and counseling.

## 2015-08-24 NOTE — Patient Instructions (Signed)
Use tylenol first then use Advil or Aleve

## 2015-09-01 LAB — ALKALINE PHOSPHATASE ISOENZYMES
ALKALINE PHOSPHATASE (ALP ISO): 244 U/L — AB (ref 33–130)
BONE ISOENZYMES (ALP ISO): 5 % — AB (ref 28–66)
Intestinal Isoenzymes: 0 % — ABNORMAL LOW (ref 1–24)
LIVER ISOENZYMES (ALP ISO): 85 % — AB (ref 25–69)
Macrohepatic isoenzymes: 10 % — ABNORMAL HIGH

## 2015-09-04 NOTE — Addendum Note (Signed)
Addended by: Denita Lung on: 09/04/2015 12:58 PM   Modules accepted: Orders

## 2015-09-19 ENCOUNTER — Ambulatory Visit
Admission: RE | Admit: 2015-09-19 | Discharge: 2015-09-19 | Disposition: A | Discharge status: Home / Self Care | Payer: Commercial Managed Care - HMO | Source: Ambulatory Visit | Attending: Family Medicine | Admitting: Family Medicine

## 2015-09-19 DIAGNOSIS — R748 Abnormal levels of other serum enzymes: Secondary | ICD-10-CM

## 2015-09-26 ENCOUNTER — Ambulatory Visit
Admission: RE | Admit: 2015-09-26 | Discharge: 2015-09-26 | Disposition: A | Discharge status: Home / Self Care | Payer: Commercial Managed Care - HMO | Source: Ambulatory Visit | Attending: Family Medicine | Admitting: Family Medicine

## 2015-09-26 DIAGNOSIS — E2839 Other primary ovarian failure: Secondary | ICD-10-CM

## 2015-09-26 DIAGNOSIS — Z1231 Encounter for screening mammogram for malignant neoplasm of breast: Secondary | ICD-10-CM | POA: Diagnosis not present

## 2015-09-26 DIAGNOSIS — M85852 Other specified disorders of bone density and structure, left thigh: Secondary | ICD-10-CM | POA: Diagnosis not present

## 2015-09-27 ENCOUNTER — Encounter: Payer: Self-pay | Admitting: Family Medicine

## 2015-09-27 DIAGNOSIS — M8580 Other specified disorders of bone density and structure, unspecified site: Secondary | ICD-10-CM

## 2015-10-04 ENCOUNTER — Other Ambulatory Visit: Payer: Self-pay | Admitting: Family Medicine

## 2015-10-04 DIAGNOSIS — R928 Other abnormal and inconclusive findings on diagnostic imaging of breast: Secondary | ICD-10-CM

## 2015-10-10 ENCOUNTER — Ambulatory Visit
Admission: RE | Admit: 2015-10-10 | Discharge: 2015-10-10 | Disposition: A | Discharge status: Home / Self Care | Payer: Commercial Managed Care - HMO | Source: Ambulatory Visit | Attending: Family Medicine | Admitting: Family Medicine

## 2015-10-10 ENCOUNTER — Other Ambulatory Visit: Payer: Self-pay | Admitting: Family Medicine

## 2015-10-10 DIAGNOSIS — R928 Other abnormal and inconclusive findings on diagnostic imaging of breast: Secondary | ICD-10-CM

## 2015-10-10 DIAGNOSIS — R921 Mammographic calcification found on diagnostic imaging of breast: Secondary | ICD-10-CM | POA: Diagnosis not present

## 2015-10-18 ENCOUNTER — Other Ambulatory Visit: Payer: Self-pay | Admitting: Family Medicine

## 2015-10-18 ENCOUNTER — Ambulatory Visit
Admission: RE | Admit: 2015-10-18 | Discharge: 2015-10-18 | Disposition: A | Discharge status: Home / Self Care | Payer: Commercial Managed Care - HMO | Source: Ambulatory Visit | Attending: Family Medicine | Admitting: Family Medicine

## 2015-10-18 DIAGNOSIS — R921 Mammographic calcification found on diagnostic imaging of breast: Secondary | ICD-10-CM | POA: Diagnosis not present

## 2015-10-18 DIAGNOSIS — R928 Other abnormal and inconclusive findings on diagnostic imaging of breast: Secondary | ICD-10-CM

## 2015-10-18 DIAGNOSIS — D241 Benign neoplasm of right breast: Secondary | ICD-10-CM | POA: Diagnosis not present

## 2016-01-11 ENCOUNTER — Encounter: Payer: Self-pay | Admitting: Family Medicine

## 2016-01-11 ENCOUNTER — Ambulatory Visit (INDEPENDENT_AMBULATORY_CARE_PROVIDER_SITE_OTHER): Payer: Commercial Managed Care - HMO | Admitting: Family Medicine

## 2016-01-11 VITALS — BP 140/80 | HR 80 | Wt 182.0 lb

## 2016-01-11 DIAGNOSIS — C4491 Basal cell carcinoma of skin, unspecified: Secondary | ICD-10-CM | POA: Diagnosis not present

## 2016-01-11 NOTE — Progress Notes (Signed)
   Subjective:    Patient ID: Gabriela Davis, female    DOB: 1941/04/03, 75 y.o.   MRN: [id number]  HPI She is here for evaluation of a lesion on her left upper back area. It is itching her.   Review of Systems     Objective:   Physical Exam She has a raised waxy 1 cm lesion with some surrounding pigmented changes present on the left upper back.       Assessment & Plan:  Skin cancer, basal cell she will return for excision of this area will also discuss irregular heartbeat at that time.

## 2016-01-19 ENCOUNTER — Other Ambulatory Visit: Payer: Self-pay | Admitting: Family Medicine

## 2016-01-19 ENCOUNTER — Ambulatory Visit (INDEPENDENT_AMBULATORY_CARE_PROVIDER_SITE_OTHER): Payer: Commercial Managed Care - HMO | Admitting: Family Medicine

## 2016-01-19 VITALS — BP 148/70 | HR 88 | Wt 182.2 lb

## 2016-01-19 DIAGNOSIS — C44509 Unspecified malignant neoplasm of skin of other part of trunk: Secondary | ICD-10-CM | POA: Diagnosis not present

## 2016-01-19 DIAGNOSIS — C44519 Basal cell carcinoma of skin of other part of trunk: Secondary | ICD-10-CM | POA: Diagnosis not present

## 2016-01-19 NOTE — Progress Notes (Signed)
   Subjective:    Patient ID: Gabriela Davis, female    DOB: July 12, 1941, 75 y.o.   MRN: [id number]  HPI He is here for excision of a lesion on her left upper back.    Review of Systems     Objective:   Physical Exam The lesion centrally was roughly 1 cm but surrounding abnormal tissue was between 1 and 2 cm.        Assessment & Plan:  Cancer of skin of back an elliptical incision of approximately 4 cm was made and the lesion was removed. 4 5-0 nonabsorbable sutures were placed with good hemostasis. She is to return here in 1 week for suture removal. There is also another lesion present in the central back area that I would like her to return and have this reevaluated later.

## 2016-01-20 ENCOUNTER — Telehealth: Payer: Self-pay | Admitting: *Deleted

## 2016-01-20 NOTE — Telephone Encounter (Signed)
Patient called wanting to know if it's ok to remove bandage after having mole removed on 3/2. Left VM informing that per Dr Redmond School it's ok to remove bandage

## 2016-01-26 ENCOUNTER — Ambulatory Visit (INDEPENDENT_AMBULATORY_CARE_PROVIDER_SITE_OTHER): Payer: Commercial Managed Care - HMO | Admitting: Family Medicine

## 2016-01-26 ENCOUNTER — Other Ambulatory Visit: Payer: Self-pay | Admitting: Family Medicine

## 2016-01-26 DIAGNOSIS — C4491 Basal cell carcinoma of skin, unspecified: Secondary | ICD-10-CM

## 2016-01-26 DIAGNOSIS — C44519 Basal cell carcinoma of skin of other part of trunk: Secondary | ICD-10-CM | POA: Diagnosis not present

## 2016-01-26 NOTE — Progress Notes (Signed)
   Subjective:    Patient ID: Gabriela Davis, female    DOB: [birth year], 75 y.o.   MRN: [id number]  HPI Instrument for suture removal. The biopsy did show basal cell cancer. She also has a lesion lower on her mid back that she would like evaluated.   Review of Systems     Objective:   Physical Exam Exam of the previous scar does show some erythema indicating reaction to the sutures. She also has a 2 x 2 centimeter slightly raised irritated lesion over the mid back area above the bra line.       Assessment & Plan:  Skin cancer, basal cell sutures were removed without difficulty. The other lesion was injected with Xylocaine and epinephrine. A 2 mm punch biopsy was taken. I decided to do this in preparation for this being basal cell cancer and then referral because of its size

## 2016-02-07 ENCOUNTER — Ambulatory Visit: Payer: Self-pay | Admitting: General Surgery

## 2016-02-07 DIAGNOSIS — C4491 Basal cell carcinoma of skin, unspecified: Secondary | ICD-10-CM | POA: Diagnosis not present

## 2016-02-07 NOTE — H&P (Signed)
History of Present Illness Gabriela Davis; 02/07/2016 9:13 AM) Patient words: skin cancer.  The patient is a 75 year old female who presents with a skin neoplasm. Patient is a 75 year old female who is referred by Dr. Jill Alexanders for an evaluation for basal cell carcinoma to the back. Patient had a punch biopsy which revealed a basal cell carcinoma. Closest margin was 0.2 mm laterally.   Patient also had a larger lesion to the midcentral portion of her back was concern for the same. Secondary to the fact that this was a larger area she was referred for surgical excision.   Other Problems (Ammie Eversole, LPN; QA348G 624THL AM) Cerebrovascular Accident High blood pressure Melanoma  Past Surgical History (Ammie Eversole, LPN; QA348G 624THL AM) Appendectomy Colon Removal - Complete Gallbladder Surgery - Open Hysterectomy (not due to cancer) - Partial Tonsillectomy  Diagnostic Studies History (Ammie Eversole, LPN; QA348G 624THL AM) Mammogram within last year  Allergies (Ammie Eversole, LPN; QA348G 624THL AM) Cephalexin *CEPHALOSPORINS* Morphine Sulfate (Concentrate) *ANALGESICS - OPIOID* Penicillins  Medication History (Ammie Eversole, LPN; QA348G 5762FGE AM) Lisinopril-Hydrochlorothiazide (20-12.5MG  Tablet, Oral) Active. Multiple Vitamin (Oral) Active. Aspirin (81MG  Tablet Chewable, Oral) Active. Medications Reconciled  Social History (Aleatha Borer, LPN; QA348G 624THL AM) Alcohol use Occasional alcohol use. Caffeine use Coffee. No drug use Tobacco use Current some day smoker.  Family History Aleatha Borer, LPN; QA348G 624THL AM) First Degree Relatives No pertinent family history  Pregnancy / Birth History Aleatha Borer, LPN; QA348G 624THL AM) Durenda Age 3 Maternal age 67-20 Para 2    Review of Systems (Ammie Eversole LPN; QA348G 624THL AM) General Not Present- Appetite Loss, Chills, Fatigue, Fever, Night Sweats, Weight Gain  and Weight Loss. Skin Present- Change in Wart/Mole. Not Present- Dryness, Hives, Jaundice, New Lesions, Non-Healing Wounds, Rash and Ulcer. HEENT Not Present- Earache, Hearing Loss, Hoarseness, Nose Bleed, Oral Ulcers, Ringing in the Ears, Seasonal Allergies, Sinus Pain, Sore Throat, Visual Disturbances, Wears glasses/contact lenses and Yellow Eyes. Respiratory Not Present- Bloody sputum, Chronic Cough, Difficulty Breathing, Snoring and Wheezing. Breast Not Present- Breast Mass, Breast Pain, Nipple Discharge and Skin Changes. Cardiovascular Not Present- Chest Pain, Difficulty Breathing Lying Down, Leg Cramps, Palpitations, Rapid Heart Rate, Shortness of Breath and Swelling of Extremities. Gastrointestinal Not Present- Abdominal Pain, Bloating, Bloody Stool, Change in Bowel Habits, Chronic diarrhea, Constipation, Difficulty Swallowing, Excessive gas, Gets full quickly at meals, Hemorrhoids, Indigestion, Nausea, Rectal Pain and Vomiting. Female Genitourinary Not Present- Frequency, Nocturia, Painful Urination, Pelvic Pain and Urgency. Musculoskeletal Not Present- Back Pain, Joint Pain, Joint Stiffness, Muscle Pain, Muscle Weakness and Swelling of Extremities. Neurological Not Present- Decreased Memory, Fainting, Headaches, Numbness, Seizures, Tingling, Tremor, Trouble walking and Weakness. Psychiatric Not Present- Anxiety, Bipolar, Change in Sleep Pattern, Depression, Fearful and Frequent crying. Endocrine Not Present- Cold Intolerance, Excessive Hunger, Hair Changes, Heat Intolerance, Hot flashes and New Diabetes. Hematology Not Present- Easy Bruising, Excessive bleeding, Gland problems, HIV and Persistent Infections.  Vitals (Ammie Eversole LPN; QA348G 075-GRM AM) 02/07/2016 8:38 AM Weight: 175.8 lb Height: 67in Body Surface Area: 1.91 m Body Mass Index: 27.53 kg/m  Temp.: 98.62F(Oral)  Pulse: 82 (Regular)  BP: 142/78 (Sitting, Left Arm, Standard)       Physical Exam Gabriela Davis; 02/07/2016 9:14 AM) Gabriela Bathe Note: Left upper back lesion previous biopsy site  1 x 2 cm mid back lesion     Assessment & Plan Gabriela Davis; 02/07/2016 9:15 AM) BASAL CELL CARCINOMA (C44.91) Impression: 75 year old female with basal cell carcinoma per biopsy,  likely basal cell midportion of her back  1. Will proceed to the operating for wide local excision of previous biopsy site to obtain appropriate margins. 2. We will surgically excise the mid back lesion with reapproximation. 3. Discussed with the patient the risks and benefits of the procedure to include but not limited to: Possibly for further surgery, hematoma, skin infection, recurrence. Patient was understanding and wishes to proceed.

## 2016-02-15 NOTE — Patient Instructions (Signed)
Gabriela Davis  02/15/2016   Your procedure is scheduled on: 02/20/2016   Report to [facility]  Entrance take [facility]  elevators to 3rd floor to  [place] at    1030 AM.  Call this number if you have problems the morning of surgery [phone]   Remember: ONLY 1 PERSON MAY GO WITH YOU TO SHORT STAY TO GET  READY MORNING OF [place].  Do not eat food or drink liquids :After Midnight.     Take these medicines the morning of surgery with A SIP OF WATER: none                                 You may not have any metal on your body including hair pins and              piercings  Do not wear jewelry, make-up, lotions, powders or perfumes, deodorant             Do not wear nail polish.  Do not shave  48 hours prior to surgery.                 Do not bring valuables to the hospital. [place].  Contacts, dentures or bridgework may not be worn into surgery.       Patients discharged the day of surgery will not be allowed to drive home.  Name and phone number of your driver:  Special Instructions: coughing and deep breathing exercises, leg exercises               Please read over the following fact sheets you were given: _____________________________________________________________________             [facility] - Preparing for Surgery Before surgery, you can play an important role.  Because skin is not sterile, your skin needs to be as free of germs as possible.  You can reduce the number of germs on your skin by washing with CHG (chlorahexidine gluconate) soap before surgery.  CHG is an antiseptic cleaner which kills germs and bonds with the skin to continue killing germs even after washing. Please DO NOT use if you have an allergy to CHG or antibacterial soaps.  If your skin becomes reddened/irritated stop using the CHG and inform your nurse when you arrive at Short Stay. Do not shave (including  legs and underarms) for at least 48 hours prior to the first CHG shower.  You may shave your face/neck. Please follow these instructions carefully:  1.  Shower with CHG Soap the night before surgery and the  morning of Surgery.  2.  If you choose to wash your hair, wash your hair first as usual with your  normal  shampoo.  3.  After you shampoo, rinse your hair and body thoroughly to remove the  shampoo.                           4.  Use CHG as you would any other liquid soap.  You can apply chg directly  to the skin and wash  Gently with a scrungie or clean washcloth.  5.  Apply the CHG Soap to your body ONLY FROM THE NECK DOWN.   Do not use on face/ open                           Wound or open sores. Avoid contact with eyes, ears mouth and genitals (private parts).                       Wash face,  Genitals (private parts) with your normal soap.             6.  Wash thoroughly, paying special attention to the area where your surgery  will be performed.  7.  Thoroughly rinse your body with warm water from the neck down.  8.  DO NOT shower/wash with your normal soap after using and rinsing off  the CHG Soap.                9.  Pat yourself dry with a clean towel.            10.  Wear clean pajamas.            11.  Place clean sheets on your bed the night of your first shower and do not  sleep with pets. Day of Surgery : Do not apply any lotions/deodorants the morning of surgery.  Please wear clean clothes to the hospital/surgery center.  FAILURE TO FOLLOW THESE INSTRUCTIONS MAY RESULT IN THE CANCELLATION OF YOUR SURGERY PATIENT SIGNATURE_________________________________  NURSE SIGNATURE__________________________________  ________________________________________________________________________

## 2016-02-16 ENCOUNTER — Encounter (HOSPITAL_COMMUNITY): Payer: Self-pay

## 2016-02-16 ENCOUNTER — Encounter (HOSPITAL_COMMUNITY)
Admission: RE | Admit: 2016-02-16 | Discharge: 2016-02-16 | Disposition: A | Discharge status: Home / Self Care | Payer: Commercial Managed Care - HMO | Source: Ambulatory Visit | Attending: General Surgery | Admitting: General Surgery

## 2016-02-16 DIAGNOSIS — Z01810 Encounter for preprocedural cardiovascular examination: Secondary | ICD-10-CM | POA: Diagnosis not present

## 2016-02-16 DIAGNOSIS — Z01812 Encounter for preprocedural laboratory examination: Secondary | ICD-10-CM | POA: Insufficient documentation

## 2016-02-16 DIAGNOSIS — C44519 Basal cell carcinoma of skin of other part of trunk: Secondary | ICD-10-CM | POA: Insufficient documentation

## 2016-02-16 DIAGNOSIS — I10 Essential (primary) hypertension: Secondary | ICD-10-CM | POA: Insufficient documentation

## 2016-02-16 HISTORY — DX: Unspecified osteoarthritis, unspecified site: M19.90

## 2016-02-16 HISTORY — DX: Cerebral infarction, unspecified: I63.9

## 2016-02-16 LAB — CBC
HEMATOCRIT: 47.8 % — AB (ref 36.0–46.0)
Hemoglobin: 16.5 g/dL — ABNORMAL HIGH (ref 12.0–15.0)
MCH: 33.6 pg (ref 26.0–34.0)
MCHC: 34.5 g/dL (ref 30.0–36.0)
MCV: 97.4 fL (ref 78.0–100.0)
PLATELETS: 232 10*3/uL (ref 150–400)
RBC: 4.91 MIL/uL (ref 3.87–5.11)
RDW: 13.2 % (ref 11.5–15.5)
WBC: 5 10*3/uL (ref 4.0–10.5)

## 2016-02-16 LAB — BASIC METABOLIC PANEL
Anion gap: 8 (ref 5–15)
BUN: 15 mg/dL (ref 6–20)
CO2: 31 mmol/L (ref 22–32)
CREATININE: 0.75 mg/dL (ref 0.44–1.00)
Calcium: 9.5 mg/dL (ref 8.9–10.3)
Chloride: 99 mmol/L — ABNORMAL LOW (ref 101–111)
GFR calc Af Amer: >60 mL/min (ref >60)
GLUCOSE: 111 mg/dL — AB (ref 65–99)
POTASSIUM: 4.4 mmol/L (ref 3.5–5.1)
Sodium: 138 mmol/L (ref 135–145)

## 2016-02-16 NOTE — Progress Notes (Addendum)
Final EKg done 02/16/16 in EPIC.   LOV-PCP-01/26/16-EPIC

## 2016-02-19 MED ORDER — VANCOMYCIN HCL 10 G IV SOLR
1500.0000 mg | INTRAVENOUS | Status: AC
  Administered 2016-02-20: 1500 mg via INTRAVENOUS
  Filled 2016-02-19: qty 1500

## 2016-02-20 ENCOUNTER — Ambulatory Visit (HOSPITAL_COMMUNITY): Payer: Commercial Managed Care - HMO | Admitting: Anesthesiology

## 2016-02-20 ENCOUNTER — Encounter (HOSPITAL_COMMUNITY)
Admission: RE | Disposition: A | Discharge status: Home / Self Care | Payer: Self-pay | Source: Ambulatory Visit | Attending: General Surgery

## 2016-02-20 ENCOUNTER — Encounter (HOSPITAL_COMMUNITY): Payer: Self-pay | Admitting: *Deleted

## 2016-02-20 ENCOUNTER — Ambulatory Visit (HOSPITAL_COMMUNITY)
Admission: RE | Admit: 2016-02-20 | Discharge: 2016-02-20 | Disposition: A | Discharge status: Home / Self Care | Payer: Commercial Managed Care - HMO | Source: Ambulatory Visit | Attending: General Surgery | Admitting: General Surgery

## 2016-02-20 DIAGNOSIS — C44519 Basal cell carcinoma of skin of other part of trunk: Secondary | ICD-10-CM | POA: Diagnosis not present

## 2016-02-20 DIAGNOSIS — F17210 Nicotine dependence, cigarettes, uncomplicated: Secondary | ICD-10-CM | POA: Diagnosis not present

## 2016-02-20 DIAGNOSIS — J449 Chronic obstructive pulmonary disease, unspecified: Secondary | ICD-10-CM | POA: Diagnosis not present

## 2016-02-20 DIAGNOSIS — I10 Essential (primary) hypertension: Secondary | ICD-10-CM | POA: Insufficient documentation

## 2016-02-20 DIAGNOSIS — L578 Other skin changes due to chronic exposure to nonionizing radiation: Secondary | ICD-10-CM | POA: Diagnosis not present

## 2016-02-20 DIAGNOSIS — C4491 Basal cell carcinoma of skin, unspecified: Secondary | ICD-10-CM | POA: Diagnosis not present

## 2016-02-20 DIAGNOSIS — C44599 Other specified malignant neoplasm of skin of other part of trunk: Secondary | ICD-10-CM | POA: Diagnosis not present

## 2016-02-20 HISTORY — PX: EXCISION OF BACK LESION: SHX6597

## 2016-02-20 SURGERY — EXCISION, LESION, BACK
Anesthesia: General | Site: Back

## 2016-02-20 MED ORDER — SODIUM CHLORIDE 0.9 % IR SOLN
Status: DC | PRN
  Administered 2016-02-20: 1000 mL

## 2016-02-20 MED ORDER — PROPOFOL 10 MG/ML IV BOLUS
INTRAVENOUS | Status: DC | PRN
  Administered 2016-02-20: 200 mg via INTRAVENOUS

## 2016-02-20 MED ORDER — MORPHINE SULFATE (PF) 10 MG/ML IV SOLN
2.0000 mg | INTRAVENOUS | Status: DC | PRN
Start: 2016-02-20 — End: 2016-02-20

## 2016-02-20 MED ORDER — BUPIVACAINE-EPINEPHRINE (PF) 0.25% -1:200000 IJ SOLN
INTRAMUSCULAR | Status: AC
  Filled 2016-02-20: qty 30

## 2016-02-20 MED ORDER — DEXAMETHASONE SODIUM PHOSPHATE 10 MG/ML IJ SOLN
INTRAMUSCULAR | Status: DC | PRN
  Administered 2016-02-20: 10 mg via INTRAVENOUS

## 2016-02-20 MED ORDER — SODIUM CHLORIDE 0.9% FLUSH: 3.0000 mL | Freq: Two times a day (BID) | INTRAVENOUS | Status: DC

## 2016-02-20 MED ORDER — MEPERIDINE HCL 50 MG/ML IJ SOLN: 6.2500 mg | INTRAMUSCULAR | Status: DC | PRN

## 2016-02-20 MED ORDER — HYDROMORPHONE HCL 1 MG/ML IJ SOLN: 0.2500 mg | INTRAMUSCULAR | Status: DC | PRN

## 2016-02-20 MED ORDER — LACTATED RINGERS IV SOLN
INTRAVENOUS | Status: DC
  Administered 2016-02-20: 11:00:00 via INTRAVENOUS

## 2016-02-20 MED ORDER — LIDOCAINE HCL (PF) 1 % IJ SOLN: INTRAMUSCULAR | Status: DC | PRN

## 2016-02-20 MED ORDER — DEXAMETHASONE SODIUM PHOSPHATE 10 MG/ML IJ SOLN
INTRAMUSCULAR | Status: AC
  Filled 2016-02-20: qty 1

## 2016-02-20 MED ORDER — OXYCODONE-ACETAMINOPHEN 5-325 MG PO TABS: 1.0000 | ORAL_TABLET | ORAL | Status: DC | PRN

## 2016-02-20 MED ORDER — SUCCINYLCHOLINE CHLORIDE 20 MG/ML IJ SOLN
INTRAMUSCULAR | Status: DC | PRN
  Administered 2016-02-20: 100 mg via INTRAVENOUS

## 2016-02-20 MED ORDER — BUPIVACAINE-EPINEPHRINE 0.25% -1:200000 IJ SOLN
INTRAMUSCULAR | Status: DC | PRN
  Administered 2016-02-20: 18 mL

## 2016-02-20 MED ORDER — SODIUM CHLORIDE 0.9 % IV SOLN: 250.0000 mL | INTRAVENOUS | Status: DC | PRN

## 2016-02-20 MED ORDER — LIDOCAINE HCL 1 % IJ SOLN
INTRAMUSCULAR | Status: AC
  Filled 2016-02-20: qty 20

## 2016-02-20 MED ORDER — ACETAMINOPHEN 325 MG PO TABS: 650.0000 mg | ORAL_TABLET | ORAL | Status: DC | PRN

## 2016-02-20 MED ORDER — PHENYLEPHRINE HCL 10 MG/ML IJ SOLN
INTRAMUSCULAR | Status: DC | PRN
  Administered 2016-02-20 (×5): 80 ug via INTRAVENOUS

## 2016-02-20 MED ORDER — ONDANSETRON HCL 4 MG/2ML IJ SOLN
INTRAMUSCULAR | Status: AC
  Filled 2016-02-20: qty 2

## 2016-02-20 MED ORDER — PROPOFOL 10 MG/ML IV BOLUS
INTRAVENOUS | Status: AC
  Filled 2016-02-20: qty 20

## 2016-02-20 MED ORDER — SODIUM CHLORIDE 0.9% FLUSH: 3.0000 mL | INTRAVENOUS | Status: DC | PRN

## 2016-02-20 MED ORDER — FENTANYL CITRATE (PF) 100 MCG/2ML IJ SOLN
INTRAMUSCULAR | Status: DC | PRN
  Administered 2016-02-20 (×2): 50 ug via INTRAVENOUS

## 2016-02-20 MED ORDER — ONDANSETRON HCL 4 MG/2ML IJ SOLN
INTRAMUSCULAR | Status: DC | PRN
  Administered 2016-02-20: 4 mg via INTRAVENOUS

## 2016-02-20 MED ORDER — SUGAMMADEX SODIUM 200 MG/2ML IV SOLN
INTRAVENOUS | Status: AC
  Filled 2016-02-20: qty 4

## 2016-02-20 MED ORDER — FENTANYL CITRATE (PF) 100 MCG/2ML IJ SOLN
INTRAMUSCULAR | Status: AC
  Filled 2016-02-20: qty 2

## 2016-02-20 MED ORDER — CHLORHEXIDINE GLUCONATE 4 % EX LIQD: 1.0000 "application " | Freq: Once | CUTANEOUS | Status: DC

## 2016-02-20 MED ORDER — ACETAMINOPHEN 650 MG RE SUPP
650.0000 mg | RECTAL | Status: DC | PRN
  Filled 2016-02-20: qty 1

## 2016-02-20 MED ORDER — PHENYLEPHRINE 40 MCG/ML (10ML) SYRINGE FOR IV PUSH (FOR BLOOD PRESSURE SUPPORT)
PREFILLED_SYRINGE | INTRAVENOUS | Status: AC
  Filled 2016-02-20: qty 10

## 2016-02-20 MED ORDER — OXYCODONE HCL 5 MG PO TABS: 5.0000 mg | ORAL_TABLET | ORAL | Status: DC | PRN

## 2016-02-20 MED ORDER — LIDOCAINE HCL (CARDIAC) 20 MG/ML IV SOLN
INTRAVENOUS | Status: DC | PRN
  Administered 2016-02-20: 100 mg via INTRAVENOUS

## 2016-02-20 SURGICAL SUPPLY — 27 items
BENZOIN TINCTURE PRP APPL 2/3 (GAUZE/BANDAGES/DRESSINGS) IMPLANT
BLADE HEX COATED 2.75 (ELECTRODE) ×3 IMPLANT
COVER SURGICAL LIGHT HANDLE (MISCELLANEOUS) ×3 IMPLANT
DECANTER SPIKE VIAL GLASS SM (MISCELLANEOUS) IMPLANT
DRAPE LAPAROTOMY T 102X78X121 (DRAPES) IMPLANT
DRAPE LAPAROTOMY TRNSV 102X78 (DRAPE) IMPLANT
ELECT REM PT RETURN 9FT ADLT (ELECTROSURGICAL) ×3
ELECTRODE REM PT RTRN 9FT ADLT (ELECTROSURGICAL) ×1 IMPLANT
EVACUATOR SILICONE 100CC (DRAIN) IMPLANT
GAUZE SPONGE 4X4 12PLY STRL (GAUZE/BANDAGES/DRESSINGS) ×3 IMPLANT
GLOVE BIO SURGEON STRL SZ7.5 (GLOVE) ×6 IMPLANT
GLOVE BIOGEL PI IND STRL 7.0 (GLOVE) ×1 IMPLANT
GLOVE BIOGEL PI INDICATOR 7.0 (GLOVE) ×2
GOWN STRL REUS W/TWL XL LVL3 (GOWN DISPOSABLE) ×6 IMPLANT
KIT BASIN OR (CUSTOM PROCEDURE TRAY) ×3 IMPLANT
LIQUID BAND (GAUZE/BANDAGES/DRESSINGS) ×3 IMPLANT
MARKER SKIN DUAL TIP RULER LAB (MISCELLANEOUS) IMPLANT
NEEDLE HYPO 25X1 1.5 SAFETY (NEEDLE) ×3 IMPLANT
NS IRRIG 1000ML POUR BTL (IV SOLUTION) ×3 IMPLANT
PACK GENERAL/GYN (CUSTOM PROCEDURE TRAY) ×3 IMPLANT
STAPLER VISISTAT 35W (STAPLE) IMPLANT
SUT MNCRL AB 3-0 PS2 18 (SUTURE) ×6 IMPLANT
SUT MNCRL AB 4-0 PS2 18 (SUTURE) IMPLANT
SUT NYLON 3 0 (SUTURE) ×3 IMPLANT
SUT VIC AB 3-0 SH 18 (SUTURE) IMPLANT
SYR CONTROL 10ML LL (SYRINGE) ×3 IMPLANT
TOWEL OR 17X26 10 PK STRL BLUE (TOWEL DISPOSABLE) IMPLANT

## 2016-02-20 NOTE — Progress Notes (Signed)
SS reports no room available

## 2016-02-20 NOTE — Progress Notes (Signed)
SS notifed pt will be ready for room assignment in 15 minutes. No rooms available now.

## 2016-02-20 NOTE — Transfer of Care (Signed)
Immediate Anesthesia Transfer of Care Note  Patient: Gabriela Davis  Procedure(s) Performed: Procedure(s): WIDE LOCAL EXCISION BACK LESION X 2  (N/A)  Patient Location: PACU  Anesthesia Type:General  Level of Consciousness: sedated  Airway & Oxygen Therapy: Patient Spontanous Breathing and Patient connected to face mask oxygen  Post-op Assessment: Report given to RN and Post -op Vital signs reviewed and stable  Post vital signs: Reviewed and stable  Last Vitals:  Filed Vitals:   02/20/16 1031  BP: 151/66  Pulse: 97  Temp: 36.8 C  Resp: 20    Complications: No apparent anesthesia complications

## 2016-02-20 NOTE — Anesthesia Preprocedure Evaluation (Addendum)
Anesthesia Evaluation  Patient identified by MRN, date of birth, ID band Patient awake    Reviewed: Allergy & Precautions, NPO status , Patient's Chart, lab work & pertinent test results  Airway Mallampati: II  TM Distance: >3 FB Neck ROM: Full    Dental no notable dental hx.    Pulmonary COPD, Current Smoker,    Pulmonary exam normal breath sounds clear to auscultation       Cardiovascular hypertension, negative cardio ROS Normal cardiovascular exam Rhythm:Regular Rate:Normal     Neuro/Psych negative neurological ROS  negative psych ROS   GI/Hepatic negative GI ROS, Neg liver ROS,   Endo/Other  negative endocrine ROS  Renal/GU negative Renal ROS     Musculoskeletal negative musculoskeletal ROS (+)   Abdominal   Peds  Hematology negative hematology ROS (+)   Anesthesia Other Findings   Reproductive/Obstetrics negative OB ROS                            Anesthesia Physical Anesthesia Plan  ASA: III  Anesthesia Plan: General   Post-op Pain Management:    Induction: Intravenous  Airway Management Planned: Oral ETT  Additional Equipment:   Intra-op Plan:   Post-operative Plan: Extubation in OR  Informed Consent: I have reviewed the patients History and Physical, chart, labs and discussed the procedure including the risks, benefits and alternatives for the proposed anesthesia with the patient or authorized representative who has indicated his/her understanding and acceptance.   Dental advisory given  Plan Discussed with: CRNA  Anesthesia Plan Comments:         Anesthesia Quick Evaluation

## 2016-02-20 NOTE — H&P (View-Only) (Signed)
History of Present Illness Gabriela Ok MD; 02/07/2016 9:13 AM) Patient words: skin cancer.  The patient is a 75 year old female who presents with a skin neoplasm. Patient is a 75 year old female who is referred by Dr. Jill Davis for an evaluation for basal cell carcinoma to the back. Patient had a punch biopsy which revealed a basal cell carcinoma. Closest margin was 0.2 mm laterally.   Patient also had a larger lesion to the midcentral portion of her back was concern for the same. Secondary to the fact that this was a larger area she was referred for surgical excision.   Other Problems (Gabriela Eversole, LPN; QA348G 624THL AM) Cerebrovascular Accident High blood pressure Melanoma  Past Surgical History (Gabriela Eversole, LPN; QA348G 624THL AM) Appendectomy Colon Removal - Complete Gallbladder Surgery - Open Hysterectomy (not due to cancer) - Partial Tonsillectomy  Diagnostic Studies History (Gabriela Eversole, LPN; QA348G 624THL AM) Mammogram within last year  Allergies (Gabriela Eversole, LPN; QA348G 624THL AM) Cephalexin *CEPHALOSPORINS* Morphine Sulfate (Concentrate) *ANALGESICS - OPIOID* Penicillins  Medication History (Gabriela Eversole, LPN; QA348G 5734FGE AM) Lisinopril-Hydrochlorothiazide (20-12.5MG  Tablet, Oral) Active. Multiple Vitamin (Oral) Active. Aspirin (81MG  Tablet Chewable, Oral) Active. Medications Reconciled  Social History (Gabriela Borer, LPN; QA348G 624THL AM) Alcohol use Occasional alcohol use. Caffeine use Coffee. No drug use Tobacco use Current some day smoker.  Family History Gabriela Borer, LPN; QA348G 624THL AM) First Degree Relatives No pertinent family history  Pregnancy / Birth History Gabriela Borer, LPN; QA348G 624THL AM) Gabriela Davis 3 Maternal Davis 79-20 Para 2    Review of Systems (Gabriela Eversole LPN; QA348G 624THL AM) General Not Present- Appetite Loss, Chills, Fatigue, Fever, Night Sweats, Weight Gain  and Weight Loss. Skin Present- Change in Wart/Mole. Not Present- Dryness, Hives, Jaundice, New Lesions, Non-Healing Wounds, Rash and Ulcer. HEENT Not Present- Earache, Hearing Loss, Hoarseness, Nose Bleed, Oral Ulcers, Ringing in the Ears, Seasonal Allergies, Sinus Pain, Sore Throat, Visual Disturbances, Wears glasses/contact lenses and Yellow Eyes. Respiratory Not Present- Bloody sputum, Chronic Cough, Difficulty Breathing, Snoring and Wheezing. Breast Not Present- Breast Mass, Breast Pain, Nipple Discharge and Skin Changes. Cardiovascular Not Present- Chest Pain, Difficulty Breathing Lying Down, Leg Cramps, Palpitations, Rapid Heart Rate, Shortness of Breath and Swelling of Extremities. Gastrointestinal Not Present- Abdominal Pain, Bloating, Bloody Stool, Change in Bowel Habits, Chronic diarrhea, Constipation, Difficulty Swallowing, Excessive gas, Gets full quickly at meals, Hemorrhoids, Indigestion, Nausea, Rectal Pain and Vomiting. Female Genitourinary Not Present- Frequency, Nocturia, Painful Urination, Pelvic Pain and Urgency. Musculoskeletal Not Present- Back Pain, Joint Pain, Joint Stiffness, Muscle Pain, Muscle Weakness and Swelling of Extremities. Neurological Not Present- Decreased Memory, Fainting, Headaches, Numbness, Seizures, Tingling, Tremor, Trouble walking and Weakness. Psychiatric Not Present- Anxiety, Bipolar, Change in Sleep Pattern, Depression, Fearful and Frequent crying. Endocrine Not Present- Cold Intolerance, Excessive Hunger, Hair Changes, Heat Intolerance, Hot flashes and New Diabetes. Hematology Not Present- Easy Bruising, Excessive bleeding, Gland problems, HIV and Persistent Infections.  Vitals (Gabriela Eversole LPN; QA348G 075-GRM AM) 02/07/2016 8:38 AM Weight: 175.8 lb Height: 67in Body Surface Area: 1.91 m Body Mass Index: 27.53 kg/m  Temp.: 98.34F(Oral)  Pulse: 82 (Regular)  BP: 142/78 (Sitting, Left Arm, Standard)       Physical Exam Gabriela Ok MD; 02/07/2016 9:14 AM) Gabriela Bathe Note: Left upper back lesion previous biopsy site  1 x 2 cm mid back lesion     Assessment & Plan Gabriela Ok MD; 02/07/2016 9:15 AM) BASAL CELL CARCINOMA (C44.91) Impression: 75 year old female with basal cell carcinoma per biopsy,  likely basal cell midportion of her back  1. Will proceed to the operating for wide local excision of previous biopsy site to obtain appropriate margins. 2. We will surgically excise the mid back lesion with reapproximation. 3. Discussed with the patient the risks and benefits of the procedure to include but not limited to: Possibly for further surgery, hematoma, skin infection, recurrence. Patient was understanding and wishes to proceed.

## 2016-02-20 NOTE — Anesthesia Procedure Notes (Signed)
Procedure Name: Intubation Date/Time: 02/20/2016 12:10 PM Performed by: Lind Covert Pre-anesthesia Checklist: Patient identified, Emergency Drugs available, Suction available, Patient being monitored and Timeout performed Patient Re-evaluated:Patient Re-evaluated prior to inductionOxygen Delivery Method: Circle system utilized Preoxygenation: Pre-oxygenation with 100% oxygen Intubation Type: IV induction Laryngoscope Size: Mac and 4 Grade View: Grade I Tube type: Oral Tube size: 7.5 mm Number of attempts: 1 Airway Equipment and Method: Stylet Placement Confirmation: ETT inserted through vocal cords under direct vision,  positive ETCO2 and breath sounds checked- equal and bilateral Secured at: 21 cm Tube secured with: Tape Dental Injury: Teeth and Oropharynx as per pre-operative assessment

## 2016-02-20 NOTE — Op Note (Signed)
02/20/2016  12:55 PM  PATIENT:  Gabriela Davis  75 y.o. female  PRE-OPERATIVE DIAGNOSIS:  Basal cell carcinoma back  POST-OPERATIVE DIAGNOSIS:  Basal cell carcinoma back  PROCEDURE:  Procedure(s): WIDE LOCAL EXCISION BACK LESION X 2  (N/A)  SURGEON:  Surgeon(s) and Role:    * Ralene Ok, MD - Primary  ANESTHESIA:   local and general  EBL:    5cc  BLOOD ADMINISTERED:none  DRAINS: none   LOCAL MEDICATIONS USED:  BUPIVICAINE   SPECIMEN:  Source of Specimen:  midback lesion WLE 5x4cm-stitch lateral, WLE 5x2cm left upper lesion  DISPOSITION OF SPECIMEN:  PATHOLOGY  COUNTS:  YES  TOURNIQUET:  * No tourniquets in log *  DICTATION: .Dragon Dictation Indication of procedure: Patient is a 75 year old female with a history of the left upper back lesion, status post punch biopsy which resulted in basal cell carcinoma. Closest margin was 0.2 mm.  She also had a subsequent lesion in the midportion of her back that was too large to excise in clinic. Patient was referred for excision of the mid back lesion and we discussed reexcision of the left upper lesion secondary to obtain adequate margins.  Details of procedure: After the patient was consented she was taken back to the operating room and placed in this supine position with bilateral SCDs in place. Patient underwent general endotracheal anesthesia. Patient was in placed in the lateral position. She was prepped and draped in the usual sterile fashion. Timeout was called all facts were verified.  The left upper back incision was marked with 0.5 cm margins. Electrocautery was used to maintain hemostasis and a 15 blade was used to create the excision site. This was excised to approximately 5 x 2 cm. This was taken down to the subcutaneous tissue. There was infiltrated with 1% Marcaine with epi. Skin flaps were then raised to help reapproximate the skin. Through Vicryl sutures were used to reapproximate the deep dermal layer and  interrupted fashion. 3-0Monocryl was used to reapproximate the skin in a subcuticular fashion.  Liqui-band was used to dress the skin.  The mid back lesion was marked with 0.5 cm margins from the lesion and all edges. The ultimate excision site was 5 x 4 cm. This was excised using a 15 blade and electrocautery to maintain hemostasis. Dissection was taken down to the subcutaneous tissue. A stitch was placed in the lateral edge of the wound. Skin flaps were then raised using electrocautery. 3-0 Vicryl's were used to reapproximate the deep dermal layer in interrupted fashion. The skin was reapproximated using 4-0 Monocryl in a subcuticular fashion. The area was infiltrated with quarter percent Marcaine with epi. The skin was dressed with Dermabond.   The patient tolerated the procedure well was taken to the recovery room in stable condition.  PLAN OF CARE: Discharge to home after PACU  PATIENT DISPOSITION:  PACU - hemodynamically stable.   Delay start of Pharmacological VTE agent (>24hrs) due to surgical blood loss or risk of bleeding: not applicable

## 2016-02-20 NOTE — Anesthesia Postprocedure Evaluation (Signed)
Anesthesia Post Note  Patient: Gabriela Davis  Procedure(s) Performed: Procedure(s) (LRB): WIDE LOCAL EXCISION BACK LESION X 2  (N/A)  Patient location during evaluation: PACU Anesthesia Type: General Level of consciousness: sedated and patient cooperative Pain management: pain level controlled Vital Signs Assessment: post-procedure vital signs reviewed and stable Respiratory status: spontaneous breathing Cardiovascular status: stable Anesthetic complications: no    Last Vitals:  Filed Vitals:   02/20/16 1430 02/20/16 1445  BP: 134/87 113/57  Pulse: 71 72  Temp:    Resp: 14 20    Last Pain:  Filed Vitals:   02/20/16 1453  PainSc: 0-No pain                 Nolon Nations

## 2016-02-20 NOTE — Interval H&P Note (Signed)
History and Physical Interval Note:  02/20/2016 11:30 AM  Gabriela Davis  has presented today for surgery, with the diagnosis of Basal cell carcinoma back  The various methods of treatment have been discussed with the patient and family. After consideration of risks, benefits and other options for treatment, the patient has consented to  Procedure(s): WIDE BACK LESION (N/A) as a surgical intervention .  The patient's history has been reviewed, patient examined, no change in status, stable for surgery.  I have reviewed the patient's chart and labs.  Questions were answered to the patient's satisfaction.     Rosario Jacks., Anne Hahn

## 2016-04-03 ENCOUNTER — Encounter: Payer: Self-pay | Admitting: Family Medicine

## 2016-04-03 ENCOUNTER — Ambulatory Visit (INDEPENDENT_AMBULATORY_CARE_PROVIDER_SITE_OTHER): Payer: Commercial Managed Care - HMO | Admitting: Family Medicine

## 2016-04-03 VITALS — BP 130/70 | HR 66 | Wt 176.0 lb

## 2016-04-03 DIAGNOSIS — M25422 Effusion, left elbow: Secondary | ICD-10-CM

## 2016-04-03 NOTE — Progress Notes (Signed)
   Subjective:    Patient ID: Gabriela Davis, female    DOB: 1941-09-12, 75 y.o.   MRN: [id number]  HPI  she is here for evaluation of swelling of her left elbow. No history of injury to the elbow. No other joints are involved. He notes difficulty when she leans on the elbow especially with driving.   Review of Systems     Objective:   Physical Exam  family left elbow does show swelling of the bursa but it is not warm hot or tender. Full motion of the joint without pain or swelling in the joint.       Assessment & Plan:  Effusion of olecranon bursa, left  I explained that this is not an infection and since its only slightly mechanically getting her way, let's give it some time. If continued difficulty I will refer to orthopedics for further evaluation and treatment.

## 2016-04-05 ENCOUNTER — Telehealth: Payer: Self-pay

## 2016-04-05 NOTE — Telephone Encounter (Signed)
Pt called to report no improvement in elbow pain. Requesting referral to ortho as discussed with Dr. Redmond School. Gabriela Davis

## 2016-04-05 NOTE — Telephone Encounter (Signed)
Faxed referral to [place]

## 2016-04-05 NOTE — Telephone Encounter (Signed)
Okay to refer? 

## 2016-04-17 DIAGNOSIS — M7022 Olecranon bursitis, left elbow: Secondary | ICD-10-CM | POA: Diagnosis not present

## 2016-10-03 ENCOUNTER — Other Ambulatory Visit: Payer: Self-pay | Admitting: Family Medicine

## 2016-10-03 DIAGNOSIS — I10 Essential (primary) hypertension: Secondary | ICD-10-CM

## 2016-11-02 ENCOUNTER — Telehealth: Payer: Self-pay | Admitting: Internal Medicine

## 2016-11-02 NOTE — Telephone Encounter (Signed)
Pt has declined the cologuard testing as she is feeling fine and doesn't want it

## 2017-08-11 ENCOUNTER — Emergency Department (HOSPITAL_COMMUNITY): Payer: Medicare HMO

## 2017-08-11 ENCOUNTER — Emergency Department (HOSPITAL_COMMUNITY)
Admission: EM | Admit: 2017-08-11 | Discharge: 2017-08-12 | Disposition: A | Discharge status: Home / Self Care | Payer: Medicare HMO | Attending: Emergency Medicine | Admitting: Emergency Medicine

## 2017-08-11 DIAGNOSIS — S0091XA Abrasion of unspecified part of head, initial encounter: Secondary | ICD-10-CM | POA: Diagnosis not present

## 2017-08-11 DIAGNOSIS — S60812A Abrasion of left wrist, initial encounter: Secondary | ICD-10-CM | POA: Diagnosis not present

## 2017-08-11 DIAGNOSIS — J449 Chronic obstructive pulmonary disease, unspecified: Secondary | ICD-10-CM | POA: Diagnosis not present

## 2017-08-11 DIAGNOSIS — Y998 Other external cause status: Secondary | ICD-10-CM | POA: Diagnosis not present

## 2017-08-11 DIAGNOSIS — Z23 Encounter for immunization: Secondary | ICD-10-CM | POA: Insufficient documentation

## 2017-08-11 DIAGNOSIS — W0110XA Fall on same level from slipping, tripping and stumbling with subsequent striking against unspecified object, initial encounter: Secondary | ICD-10-CM | POA: Insufficient documentation

## 2017-08-11 DIAGNOSIS — S199XXA Unspecified injury of neck, initial encounter: Secondary | ICD-10-CM | POA: Diagnosis not present

## 2017-08-11 DIAGNOSIS — S50311A Abrasion of right elbow, initial encounter: Secondary | ICD-10-CM | POA: Diagnosis not present

## 2017-08-11 DIAGNOSIS — S0990XA Unspecified injury of head, initial encounter: Secondary | ICD-10-CM

## 2017-08-11 DIAGNOSIS — S0993XA Unspecified injury of face, initial encounter: Secondary | ICD-10-CM | POA: Diagnosis not present

## 2017-08-11 DIAGNOSIS — Z7982 Long term (current) use of aspirin: Secondary | ICD-10-CM | POA: Diagnosis not present

## 2017-08-11 DIAGNOSIS — Y929 Unspecified place or not applicable: Secondary | ICD-10-CM | POA: Insufficient documentation

## 2017-08-11 DIAGNOSIS — F17210 Nicotine dependence, cigarettes, uncomplicated: Secondary | ICD-10-CM | POA: Diagnosis not present

## 2017-08-11 DIAGNOSIS — I10 Essential (primary) hypertension: Secondary | ICD-10-CM | POA: Diagnosis not present

## 2017-08-11 DIAGNOSIS — S5001XA Contusion of right elbow, initial encounter: Secondary | ICD-10-CM | POA: Diagnosis not present

## 2017-08-11 DIAGNOSIS — S0083XA Contusion of other part of head, initial encounter: Secondary | ICD-10-CM | POA: Diagnosis not present

## 2017-08-11 DIAGNOSIS — Y9389 Activity, other specified: Secondary | ICD-10-CM | POA: Diagnosis not present

## 2017-08-11 DIAGNOSIS — S060X0A Concussion without loss of consciousness, initial encounter: Secondary | ICD-10-CM | POA: Diagnosis not present

## 2017-08-11 IMAGING — DX DG ELBOW COMPLETE 3+V*R*
4 series · 4 of 4 positions shown · non-contrast
Comparison: None.

CLINICAL DATA: Abrasion on the right posterior elbow after fall
this evening.

EXAM:
RIGHT ELBOW - COMPLETE 3+ VIEW

[elbow ap]
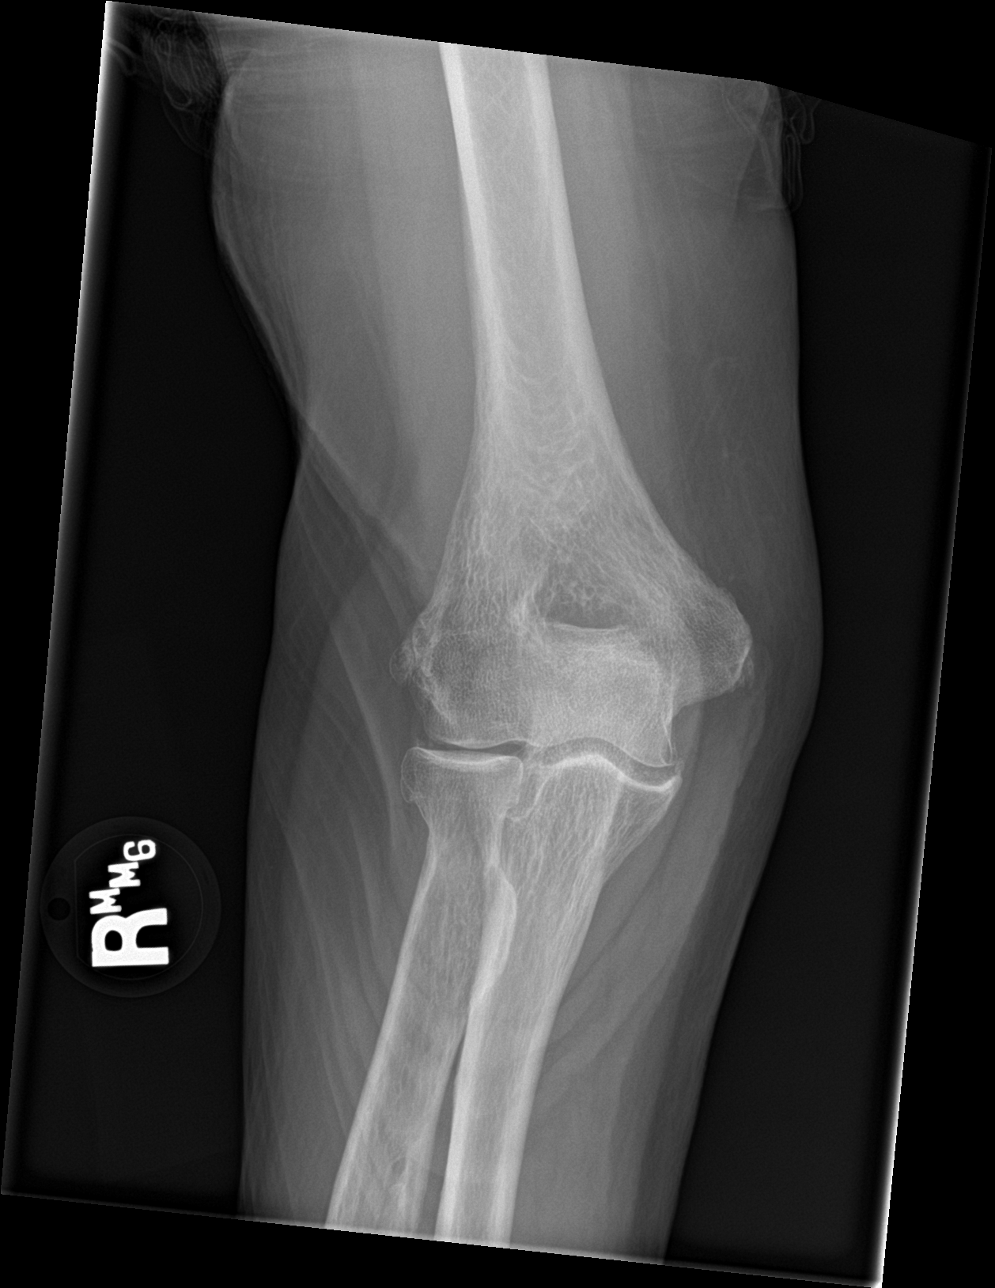

[elbow obl (1 of 2)]
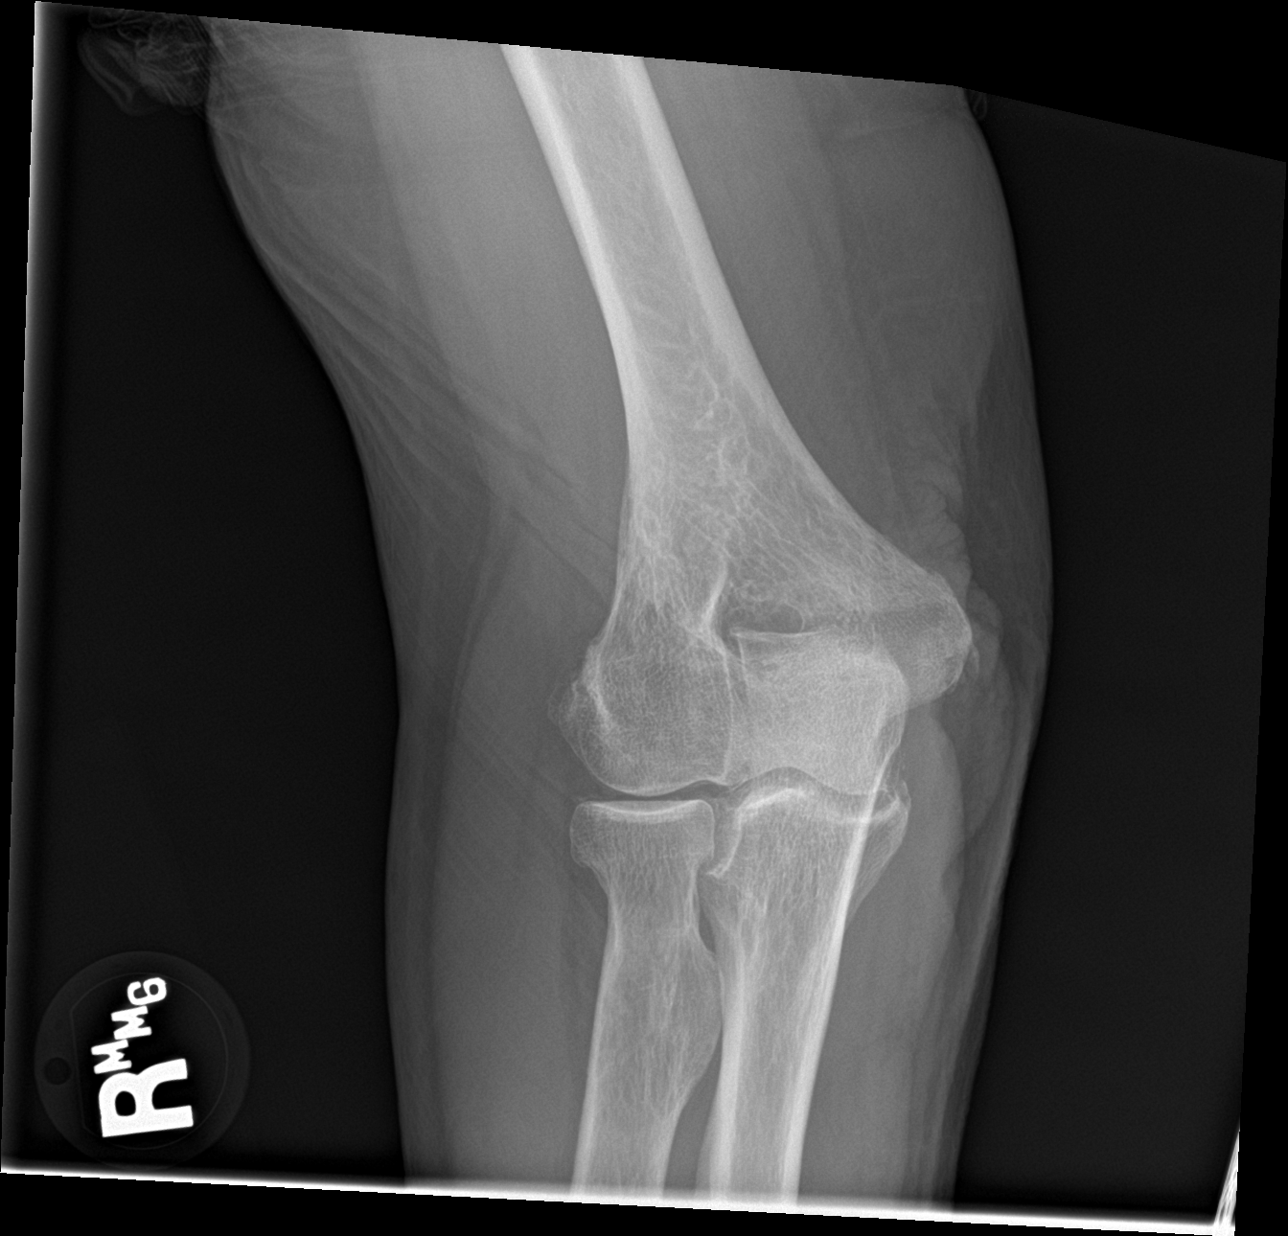

[elbow obl (2 of 2)]
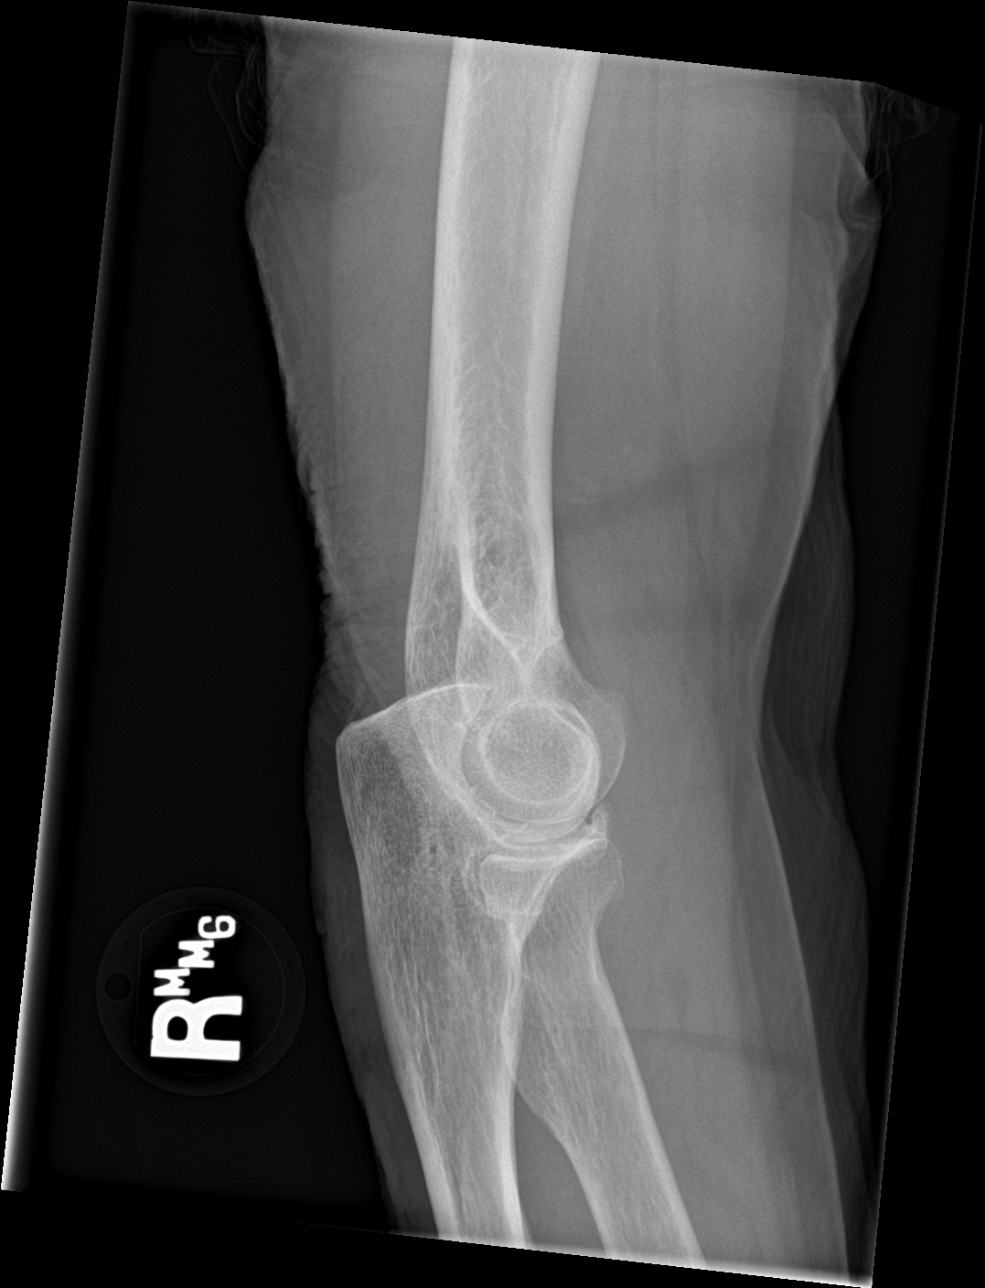

[elbow lat]
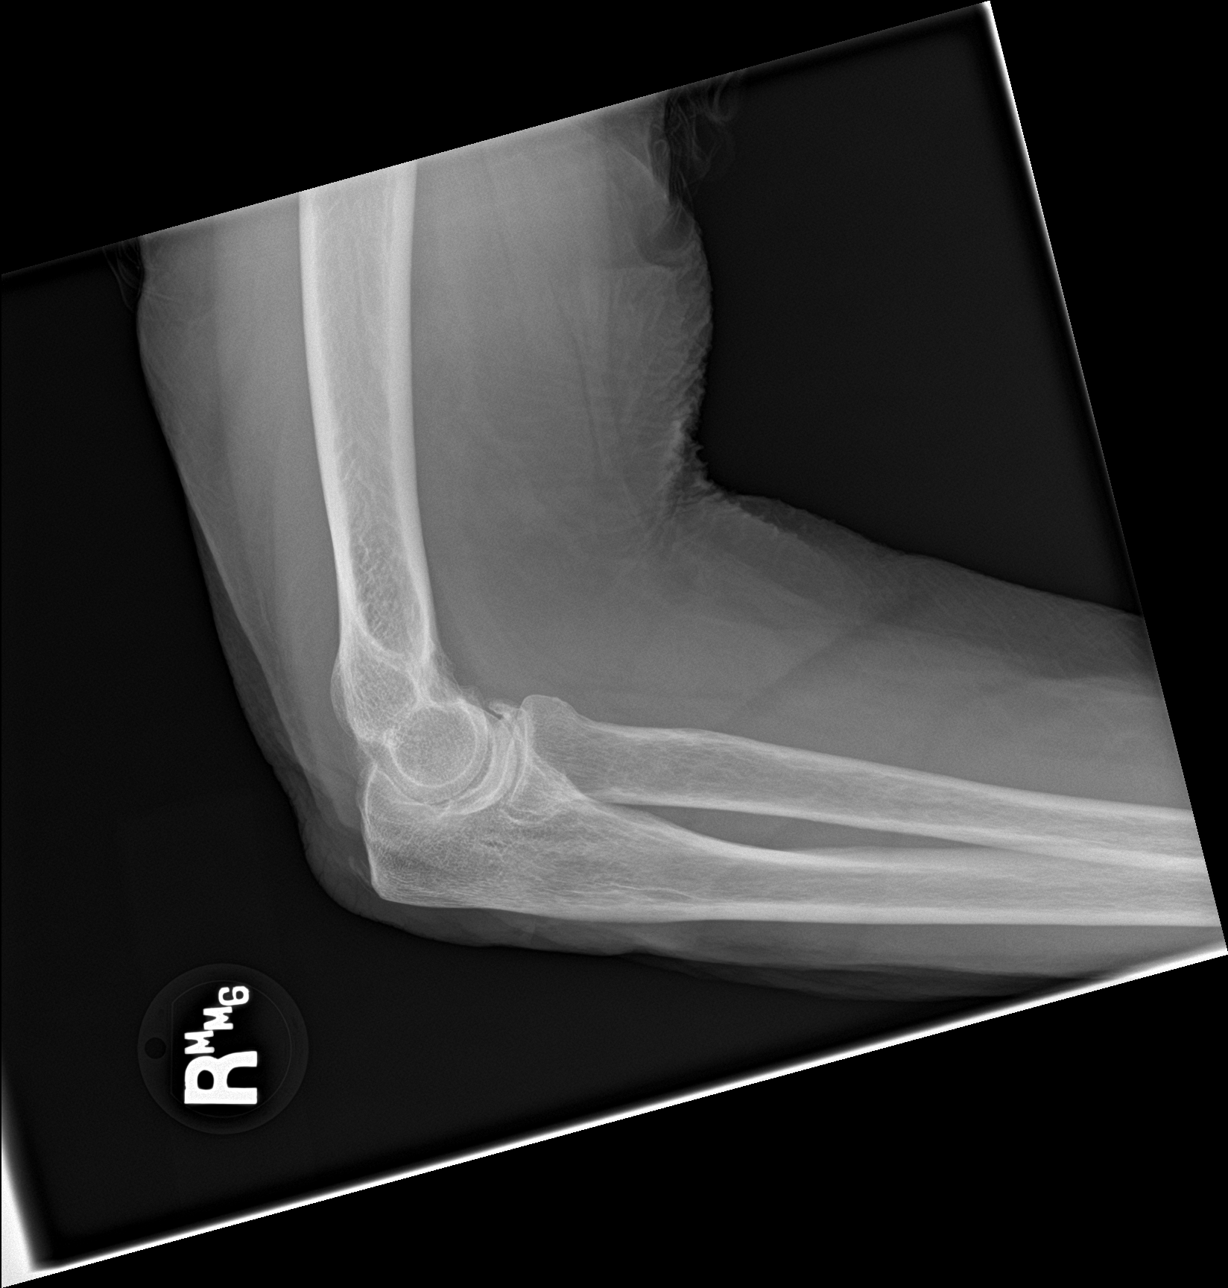

[4 of 4 positions shown; findings below may reference images not displayed]

FINDINGS: There is no evidence of fracture, dislocation, or joint effusion.
Enthesopathy at the triceps insertion on the olecranon. Spurring off
the coronoid process is also noted, degenerative in etiology. Soft
tissue calcifications adjacent to the medial and lateral epicondyle
of the humerus consistent with stigmata of epicondylitis. Soft
tissues are unremarkable.
IMPRESSION: 1. No acute fracture or dislocation of the right elbow. No joint
effusion.
2. Enthesopathy at the triceps insertion on the olecranon.
3. Stigmata of medial and lateral epicondylitis.

## 2017-08-11 MED ORDER — TETANUS-DIPHTH-ACELL PERTUSSIS 5-2.5-18.5 LF-MCG/0.5 IM SUSP
0.5000 mL | Freq: Once | INTRAMUSCULAR | Status: AC
  Administered 2017-08-11: 0.5 mL via INTRAMUSCULAR
  Filled 2017-08-11: qty 0.5
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
## 2017-08-11 NOTE — ED Notes (Addendum)
Pt very active. Repeatedly Attempting to remove c-collar against RN instruction. Pt refusing to follow instructions to remain in place on stretcher.
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
## 2017-08-11 NOTE — ED Notes (Signed)
Pt refused to get in a gown

## 2017-08-11 NOTE — ED Notes (Signed)
Patient transported to X-ray 

## 2017-08-11 NOTE — ED Notes (Signed)
ED Provider at bedside. 

## 2017-08-12 NOTE — Discharge Instructions (Signed)
Tylenol or motrin for pain. Ice pack several times a day. Bacitracin to the abrasions twice a day. Follow up with family doctor for recheck. Return if worsening.

## 2017-08-15 ENCOUNTER — Inpatient Hospital Stay: Payer: Commercial Managed Care - HMO | Admitting: Family Medicine

## 2017-08-15 ENCOUNTER — Telehealth: Payer: Self-pay | Admitting: Family Medicine

## 2017-08-15 NOTE — Telephone Encounter (Signed)
Call Gabriela Davis and tell her that I see she was in the emergency room and I want to follow-up with her. You can certainly call her friend and tell her we can't tell her anything
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
## 2017-08-19 NOTE — Telephone Encounter (Signed)
Called pt reached voice mail advised needs appt per Dr Redmond School

## 2017-08-21 NOTE — Telephone Encounter (Signed)
Called pt reached vm [facility]tr[facility]. 

## 2017-09-23 ENCOUNTER — Inpatient Hospital Stay: Payer: Commercial Managed Care - HMO | Admitting: Family Medicine

## 2018-01-20 ENCOUNTER — Ambulatory Visit: Payer: Commercial Managed Care - HMO | Admitting: Family Medicine

## 2018-02-18 ENCOUNTER — Ambulatory Visit (INDEPENDENT_AMBULATORY_CARE_PROVIDER_SITE_OTHER): Payer: Medicare HMO | Admitting: Family Medicine

## 2018-02-18 ENCOUNTER — Encounter: Payer: Self-pay | Admitting: Family Medicine

## 2018-02-18 VITALS — BP 146/86 | HR 72 | Temp 98.0°F | Wt 163.0 lb

## 2018-02-18 DIAGNOSIS — R413 Other amnesia: Secondary | ICD-10-CM

## 2018-02-18 DIAGNOSIS — S0990XA Unspecified injury of head, initial encounter: Secondary | ICD-10-CM
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
## 2018-02-19 LAB — CBC WITH DIFFERENTIAL/PLATELET
BASOS ABS: 0 10*3/uL (ref 0.0–0.2)
Basos: 1 %
EOS (ABSOLUTE): 0.2 10*3/uL (ref 0.0–0.4)
Eos: 4 %
HEMOGLOBIN: 15.4 g/dL (ref 11.1–15.9)
Hematocrit: 44.5 % (ref 34.0–46.6)
IMMATURE GRANS (ABS): 0 10*3/uL (ref 0.0–0.1)
IMMATURE GRANULOCYTES: 0 %
LYMPHS: 25 %
Lymphocytes Absolute: 1.2 10*3/uL (ref 0.7–3.1)
MCH: 33.3 pg — ABNORMAL HIGH (ref 26.6–33.0)
MCHC: 34.6 g/dL (ref 31.5–35.7)
MCV: 96 fL (ref 79–97)
MONOCYTES: 5 %
Monocytes Absolute: 0.2 10*3/uL (ref 0.1–0.9)
NEUTROS PCT: 65 %
Neutrophils Absolute: 3.1 10*3/uL (ref 1.4–7.0)
PLATELETS: 228 10*3/uL (ref 150–379)
RBC: 4.63 x10E6/uL (ref 3.77–5.28)
RDW: 13.9 % (ref 12.3–15.4)
WBC: 4.7 10*3/uL (ref 3.4–10.8)

## 2018-02-19 LAB — COMPREHENSIVE METABOLIC PANEL
ALT: 19 IU/L (ref 0–32)
AST: 26 IU/L (ref 0–40)
Albumin/Globulin Ratio: 1.3 (ref 1.2–2.2)
Albumin: 4 g/dL (ref 3.5–4.8)
Alkaline Phosphatase: 233 IU/L — ABNORMAL HIGH (ref 39–117)
BUN / CREAT RATIO: 23 (ref 12–28)
BUN: 16 mg/dL (ref 8–27)
Bilirubin Total: 0.6 mg/dL (ref 0.0–1.2)
CHLORIDE: 97 mmol/L (ref 96–106)
CO2: 24 mmol/L (ref 20–29)
CREATININE: 0.7 mg/dL (ref 0.57–1.00)
Calcium: 9 mg/dL (ref 8.7–10.3)
GFR calc Af Amer: 98 mL/min/{1.73_m2} (ref >59)
GFR calc non Af Amer: 85 mL/min/{1.73_m2} (ref >59)
GLUCOSE: 92 mg/dL (ref 65–99)
Globulin, Total: 3 g/dL (ref 1.5–4.5)
Potassium: 4.3 mmol/L (ref 3.5–5.2)
Sodium: 138 mmol/L (ref 134–144)
TOTAL PROTEIN: 7 g/dL (ref 6.0–8.5)

## 2018-02-20 ENCOUNTER — Telehealth: Payer: Self-pay

## 2018-02-20 NOTE — Telephone Encounter (Signed)
Pt was called to let her know that MRI was scheduled for 02-27-18 at [facility] . She was made aware to be there by 11:30 am. Thanks [facility]

## 2018-02-20 NOTE — Telephone Encounter (Signed)
ERROR

## 2018-02-23 LAB — ALKALINE PHOSPHATASE, ISOENZYMES
Alkaline Phosphatase: 226 IU/L — ABNORMAL HIGH (ref 39–117)
BONE FRACTION: 27 % (ref 14–68)
INTESTINAL FRAC.: 4 % (ref 0–18)
LIVER FRACTION: 69 % (ref 18–85)

## 2018-02-23 LAB — SPECIMEN STATUS REPORT

## 2018-02-24 ENCOUNTER — Telehealth: Payer: Self-pay

## 2018-02-24 NOTE — Telephone Encounter (Signed)
Ms.Jean called and want pt lab results and stated that Ms.Isidoro would need a letter stating that she was unable to do community service for her lawyer. Pt would need the letter by the end of the week. [place] name is Omer Jack. Please advise . Thanks [employer]

## 2018-02-26 ENCOUNTER — Telehealth: Payer: Self-pay

## 2018-02-26 NOTE — Telephone Encounter (Signed)
Pt called back and letter is needed by Friday. Pt MRI is Friday at 12pm. Please advise [facility]

## 2018-02-26 NOTE — Telephone Encounter (Signed)
Pt says she does not remember when she needs the letter by. Pt says she will call back with the info. [place]

## 2018-02-26 NOTE — Telephone Encounter (Signed)
Patient will need formed faxed to Omer Jack- Attorney before or by Friday morning. Fax number is [phone]. Please advise.

## 2018-02-27 ENCOUNTER — Ambulatory Visit (HOSPITAL_COMMUNITY)
Admission: RE | Admit: 2018-02-27 | Discharge: 2018-02-27 | Disposition: A | Discharge status: Home / Self Care | Payer: Medicare HMO | Source: Ambulatory Visit | Attending: Family Medicine | Admitting: Family Medicine

## 2018-02-27 ENCOUNTER — Other Ambulatory Visit: Payer: Self-pay

## 2018-02-27 ENCOUNTER — Encounter: Payer: Self-pay | Admitting: Family Medicine

## 2018-02-27 DIAGNOSIS — I6782 Cerebral ischemia: Secondary | ICD-10-CM | POA: Diagnosis not present

## 2018-02-27 DIAGNOSIS — R413 Other amnesia: Secondary | ICD-10-CM | POA: Diagnosis not present

## 2018-02-27 NOTE — Telephone Encounter (Signed)
Here is the info.

## 2018-03-03 ENCOUNTER — Telehealth: Payer: Self-pay | Admitting: Family Medicine

## 2018-03-03 NOTE — Telephone Encounter (Signed)
Pt says she needs a new letter stating that she is under doctors care and currently being treated for memory and unable to due community service due to memory issues. Pt would like to pick up letter by wed. Of this week. Pt was also advised of hippa form not having friends name on it so we would need to just talk with her. Letter also need to say that she has an  Upcoming appt with neurologist soon. Thanks [employer]

## 2018-03-03 NOTE — Telephone Encounter (Signed)
See if you can get her in with a neurologist sooner.  Find out from [place] if another letter is needed.  Explain that we cannot discuss this with her friend without her sign a HIPAA form

## 2018-03-03 NOTE — Telephone Encounter (Signed)
Pt's friend, Ms Olive Bass, called stating that the neurologist is not able to see pt until July. They want to know if Dr Redmond School is ok if pt waits that long and also the letter that was sent to attorney on 4/11 was too late to use in court today so need another letter & Ms Olive Bass want to talk to Dr Redmond School about specifics. Call her at [phone]

## 2018-03-04 NOTE — Telephone Encounter (Signed)
Help me with this. 

## 2018-03-05 ENCOUNTER — Encounter: Payer: Self-pay | Admitting: Family Medicine

## 2018-03-05 ENCOUNTER — Telehealth: Payer: Self-pay | Admitting: Family Medicine

## 2018-03-05 NOTE — Telephone Encounter (Signed)
New letter typed & signed and ready for pick up.  Pt informed

## 2018-03-05 NOTE — Telephone Encounter (Signed)
Letter done

## 2018-03-05 NOTE — Telephone Encounter (Signed)
Pt came in to fill out a new DPR and while here asked about her referral to neuro. She states that Maudie Mercury was to try to get in earlier. Please advise pt.

## 2018-03-05 NOTE — Telephone Encounter (Signed)
Per Dr. Redmond School (his pt), Mickel Baas is handling this.

## 2018-03-06 NOTE — Telephone Encounter (Signed)
Per referral notes, pt refused to schedule an appointment and wanted to get in somewhere sooner. We can send to [facility] to be seen within the next couple weeks if you want. Please advise

## 2018-03-06 NOTE — Telephone Encounter (Signed)
I took care of letter, Maudie Mercury handling referral

## 2018-03-06 NOTE — Telephone Encounter (Signed)
Just spoke to [facility] and found out that pt canceled her appt with them. They are now booked out until august. I also called [facility] and they are booked out until mid June. Please advise. [place]

## 2018-03-06 NOTE — Telephone Encounter (Signed)
Keep trying.  We need to find out what happens there.  I am not going let her play games with me

## 2018-03-06 NOTE — Telephone Encounter (Signed)
When she canceled it.

## 2018-03-06 NOTE — Telephone Encounter (Signed)
Pt did not answer when called to find out why and when she canceled the appt. [place]

## 2018-03-07 NOTE — Telephone Encounter (Signed)
ok 

## 2018-03-10 NOTE — Telephone Encounter (Signed)
Apt made for may 2,2019 with Dr. Dimas Alexandria  At [facility]. [place]

## 2018-05-09 ENCOUNTER — Ambulatory Visit: Payer: Medicare HMO | Admitting: Family Medicine

## 2018-05-28 ENCOUNTER — Telehealth: Payer: Self-pay | Admitting: Family Medicine

## 2018-05-28 NOTE — Telephone Encounter (Signed)
Called and left message that pt needs a Medicare well visit has (gabs in care colon cancer screening)

## 2018-12-04 ENCOUNTER — Ambulatory Visit: Payer: Medicare HMO | Admitting: Family Medicine

## 2019-07-09 ENCOUNTER — Encounter: Payer: Self-pay | Admitting: Family Medicine

## 2019-07-09 ENCOUNTER — Ambulatory Visit (INDEPENDENT_AMBULATORY_CARE_PROVIDER_SITE_OTHER): Payer: Medicare HMO | Admitting: Family Medicine

## 2019-07-09 ENCOUNTER — Other Ambulatory Visit: Payer: Self-pay

## 2019-07-09 ENCOUNTER — Other Ambulatory Visit: Payer: Self-pay | Admitting: Family Medicine

## 2019-07-09 VITALS — BP 130/76 | HR 69 | Temp 98.0°F | Ht 64.75 in | Wt 175.0 lb

## 2019-07-09 DIAGNOSIS — E785 Hyperlipidemia, unspecified: Secondary | ICD-10-CM

## 2019-07-09 DIAGNOSIS — Z0000 Encounter for general adult medical examination without abnormal findings: Secondary | ICD-10-CM | POA: Diagnosis not present

## 2019-07-09 DIAGNOSIS — M159 Polyosteoarthritis, unspecified: Secondary | ICD-10-CM

## 2019-07-09 DIAGNOSIS — M150 Primary generalized (osteo)arthritis: Secondary | ICD-10-CM | POA: Diagnosis not present

## 2019-07-09 DIAGNOSIS — Z720 Tobacco use: Secondary | ICD-10-CM

## 2019-07-09 DIAGNOSIS — M8580 Other specified disorders of bone density and structure, unspecified site: Secondary | ICD-10-CM | POA: Diagnosis not present

## 2019-07-09 DIAGNOSIS — Z8673 Personal history of transient ischemic attack (TIA), and cerebral infarction without residual deficits: Secondary | ICD-10-CM

## 2019-07-09 DIAGNOSIS — Z23 Encounter for immunization: Secondary | ICD-10-CM | POA: Diagnosis not present

## 2019-07-09 DIAGNOSIS — I10 Essential (primary) hypertension: Secondary | ICD-10-CM | POA: Diagnosis not present

## 2019-07-09 DIAGNOSIS — I428 Other cardiomyopathies: Secondary | ICD-10-CM

## 2019-07-09 DIAGNOSIS — L309 Dermatitis, unspecified: Secondary | ICD-10-CM

## 2019-07-09 DIAGNOSIS — N63 Unspecified lump in unspecified breast: Secondary | ICD-10-CM

## 2019-07-09 NOTE — Progress Notes (Signed)
Gabriela Davis is a 78 y.o. female who presents for annual wellness visit,CPE and follow-up on chronic medical conditions.  She has the following concerns: She does have lump in her right breast that she wants me to evaluate.  She also has some skin changes under her breasts.  Otherwise she has no particular concerns or complaints.  She continues to smoke and has no desire to quit.  She states that she is breathing fine although her housemate says she has had difficulty.  She is not interested in pursuing this any further.  She does have a previous history of CVA.  No weakness, numbness, tingling.  She has a history of hypertension but presently is not on any medication.  She does have underlying arthritis but is not on any medications.  Immunizations and Health Maintenance Immunization History  Administered Date(s) Administered  . Influenza Split 08/28/2012, 09/15/2013, 09/28/2014  . Influenza, High Dose Seasonal PF 08/24/2015  . Influenza,inj,Quad PF,6+ Mos 08/17/2013  . Pneumococcal Conjugate-13 08/24/2015  . Pneumococcal Polysaccharide-23 12/09/2012  . Tdap 05/14/2008, 08/11/2017   Health Maintenance Due  Topic Date Due  . INFLUENZA VACCINE  06/20/2019    Last Pap smear:aged out Last mammogram:10-18-2015 Last colonoscopy:[place] Last DEXA:09-26-2015 Dentist:over two years Ophtho:over three yaers Exercise:little   Other doctors caring for patient include: None  Advanced directives: None on file.  Information given    Depression screen:  See questionnaire below.  Depression screen [facility] 2/9 07/09/2019 08/24/2015  Decreased Interest 0 0  Down, Depressed, Hopeless 0 0  PHQ - 2 Score 0 0    Fall Risk Screen: see questionnaire below. Fall Risk  07/09/2019 08/24/2015  Falls in the past year? 0 Yes    ADL screen:  See questionnaire below Functional Status Survey: Is the patient deaf or have difficulty hearing?: No Does the patient have difficulty seeing, even when wearing  glasses/contacts?: No Does the patient have difficulty concentrating, remembering, or making decisions?: Yes Does the patient have difficulty walking or climbing stairs?: Yes(walking get out of breath) Does the patient have difficulty dressing or bathing?: No Does the patient have difficulty doing errands alone such as visiting a doctor's office or shopping?: Yes(pt does not drive)   Review of Systems Constitutional: -, -unexpected weight change, -anorexia, -fatigue Allergy: -sneezing, -itching, -congestion Dermatology: Rash under both breasts ENT: -runny nose, -ear pain, -sore throat,  Cardiology:  -chest pain, -palpitations, -orthopnea, Respiratory: -cough, -shortness of breath, -dyspnea on exertion, -wheezing,  Gastroenterology: -abdominal pain, -nausea, -vomiting, -diarrhea, -constipation, -dysphagia Musculoskeletal: -arthralgias, -myalgias, -joint swelling, -back pain, - Ophthalmology: -vision changes,  Urology: -dysuria, -difficulty urinating,  -urinary frequency, -urgency, incontinence Neurology: -, -numbness, , -memory loss, -falls, -dizziness    PHYSICAL EXAM:  BP 130/76 (BP Location: Left Arm, Patient Position: Sitting)   Pulse 69   Temp 98 F (36.7 C)   Ht 5' 4.75" (1.645 m)   Wt 175 lb (79.4 kg)   SpO2 95%   BMI 29.35 kg/m   General Appearance: Alert, cooperative, no distress, appears stated age Head: Normocephalic, without obvious abnormality, atraumatic Eyes: PERRL, conjunctiva/corneas clear, EOM's intact, fundi benign Ears: Normal TM's and external ear canals Nose: Nares normal, mucosa normal, no drainage or sinus tenderness Throat: Lips, mucosa, and tongue normal; teeth and gums normal Neck: Supple, no lymphadenopathy;  thyroid:  no enlargement/tenderness/nodules; no carotid bruit or JVD Lungs: Clear to auscultation bilaterally without wheezes, rales or ronchi; respirations unlabored Heart: Regular rate and rhythm, S1 and S2 normal, no murmur, rubor  gallop Abdomen: Soft, non-tender, nondistended, normoactive bowel sounds,  no masses, no hepatosplenomegaly Extremities: No clubbing, cyanosis or edema Pulses: 2+ and symmetric all extremities Skin: Erythematous irregular patchy lesions under both breasts. Lymph nodes: Cervical, supraclavicular, and axillary nodes normal Neurologic:  CNII-XII intact, normal strength, sensation and gait; reflexes 2+ and symmetric throughout Psych: Normal mood, affect, hygiene and grooming. Breast exam does show a 10 cm firm irregular lesion underneath the nipple. ASSESSMENT/PLAN: Routine general medical examination at a health care facility - Plan: CBC with Differential/Platelet, Comprehensive metabolic panel, Lipid panel  Osteopenia, unspecified location no further intervention other than multivitamin with calcium and vitamin D  Tobacco abuse.  She is not ready to quit smoking  Cardiomyopathy, nonischemic ([place]) - Plan: CBC with Differential/Platelet, Comprehensive metabolic panel, Lipid panel  Hyperlipidemia, unspecified hyperlipidemia type - Plan: Lipid panel  History of CVA (cerebrovascular accident)  Dermatitis recommend she use Lamisil and call me in 2 weeks if no improvement  Need for influenza vaccination - Plan: Flu Vaccine QUAD High Dose(Fluad)  Breast mass - Plan: MM Digital Diagnostic Bilat. I discussed the fact that this is probably breast cancer and I would continue to work with her to make sure she gets appropriate care.  Essential hypertension.  Since she is not having any difficulty with that, no intervention needed nor placing her back on her antihypertensive. Primary osteoarthritis involving multiple joints.  Discussed treatment with Tylenol.     healthy diet, including goals of calcium and vitamin D intake and alcohol recommendations (less than or equal to 1 drink/day) reviewed; regular seatbelt use; changing batteries in smoke detectors.  Immunization recommendations discussed.   Colonoscopy recommendations reviewed;  Medicare Attestation I have personally reviewed: The patient's medical and social history Their use of alcohol, tobacco or illicit drugs Their current medications and supplements The patient's functional ability including ADLs,fall risks, home safety risks, cognitive, and hearing and visual impairment Diet and physical activities Evidence for depression or mood disorders  The patient's weight, height, and BMI have been recorded in the chart.  I have made referrals, counseling, and provided education to the patient based on review of the above and I have provided the patient with a written personalized care plan for preventive services.     Jill Alexanders, MD   07/09/2019

## 2019-07-09 NOTE — Patient Instructions (Signed)
  Gabriela Davis ,  Thank you for taking time to come for your Medicare Wellness Visit. I appreciate your ongoing commitment to your health goals. Please review the following plan we discussed and let me know if I can assist you in the future.   These are the goals we discussed: Call me when you are ready to quit smoking. Take a good multivitamin as well as baby aspirin every day. Use Lamisil under your breasts and then call me if that does not go away after several weeks. This is a list of the screening recommended for you and due dates:  Health Maintenance  Topic Date Due  . Flu Shot  06/20/2019  . Tetanus Vaccine  08/12/2027  . DEXA scan (bone density measurement)  Completed  . Pneumonia vaccines  Completed

## 2019-07-10 LAB — COMPREHENSIVE METABOLIC PANEL
ALT: 8 IU/L (ref 0–32)
AST: 11 IU/L (ref 0–40)
Albumin/Globulin Ratio: 1.2 (ref 1.2–2.2)
Albumin: 3.6 g/dL — ABNORMAL LOW (ref 3.7–4.7)
Alkaline Phosphatase: 250 IU/L — ABNORMAL HIGH (ref 39–117)
BUN/Creatinine Ratio: 24 (ref 12–28)
BUN: 14 mg/dL (ref 8–27)
Bilirubin Total: 1.1 mg/dL (ref 0.0–1.2)
CO2: 28 mmol/L (ref 20–29)
Calcium: 8.5 mg/dL — ABNORMAL LOW (ref 8.7–10.3)
Chloride: 99 mmol/L (ref 96–106)
Creatinine, Ser: 0.59 mg/dL (ref 0.57–1.00)
GFR calc Af Amer: 102 mL/min/{1.73_m2} (ref >59)
GFR calc non Af Amer: 89 mL/min/{1.73_m2} (ref >59)
Globulin, Total: 2.9 g/dL (ref 1.5–4.5)
Glucose: 89 mg/dL (ref 65–99)
Potassium: 4.8 mmol/L (ref 3.5–5.2)
Sodium: 139 mmol/L (ref 134–144)
Total Protein: 6.5 g/dL (ref 6.0–8.5)

## 2019-07-10 LAB — CBC WITH DIFFERENTIAL/PLATELET
Basophils Absolute: 0 10*3/uL (ref 0.0–0.2)
Basos: 1 %
EOS (ABSOLUTE): 0.1 10*3/uL (ref 0.0–0.4)
Eos: 2 %
Hematocrit: 35.6 % (ref 34.0–46.6)
Hemoglobin: 12.2 g/dL (ref 11.1–15.9)
Immature Grans (Abs): 0 10*3/uL (ref 0.0–0.1)
Immature Granulocytes: 0 %
Lymphocytes Absolute: 1 10*3/uL (ref 0.7–3.1)
Lymphs: 22 %
MCH: 34 pg — ABNORMAL HIGH (ref 26.6–33.0)
MCHC: 34.3 g/dL (ref 31.5–35.7)
MCV: 99 fL — ABNORMAL HIGH (ref 79–97)
Monocytes Absolute: 0.2 10*3/uL (ref 0.1–0.9)
Monocytes: 4 %
Neutrophils Absolute: 3.3 10*3/uL (ref 1.4–7.0)
Neutrophils: 71 %
Platelets: 178 10*3/uL (ref 150–450)
RBC: 3.59 x10E6/uL — ABNORMAL LOW (ref 3.77–5.28)
RDW: 12.3 % (ref 11.7–15.4)
WBC: 4.6 10*3/uL (ref 3.4–10.8)

## 2019-07-10 LAB — LIPID PANEL
Chol/HDL Ratio: 2.2 ratio (ref 0.0–4.4)
Cholesterol, Total: 142 mg/dL (ref 100–199)
HDL: 66 mg/dL (ref >39)
LDL Calculated: 64 mg/dL (ref 0–99)
Triglycerides: 62 mg/dL (ref 0–149)
VLDL Cholesterol Cal: 12 mg/dL (ref 5–40)

## 2019-07-13 ENCOUNTER — Telehealth: Payer: Self-pay

## 2019-07-13 NOTE — Telephone Encounter (Signed)
Pt will call back at a later date to schedule an follow up appt. [place]

## 2019-07-22 ENCOUNTER — Other Ambulatory Visit: Payer: Self-pay

## 2019-07-22 ENCOUNTER — Other Ambulatory Visit: Payer: Self-pay | Admitting: Family Medicine

## 2019-07-22 ENCOUNTER — Ambulatory Visit
Admission: RE | Admit: 2019-07-22 | Discharge: 2019-07-22 | Disposition: A | Discharge status: Home / Self Care | Payer: Medicare HMO | Source: Ambulatory Visit | Attending: Family Medicine | Admitting: Family Medicine

## 2019-07-22 DIAGNOSIS — N6011 Diffuse cystic mastopathy of right breast: Secondary | ICD-10-CM | POA: Diagnosis not present

## 2019-07-22 DIAGNOSIS — N63 Unspecified lump in unspecified breast: Secondary | ICD-10-CM

## 2019-08-03 ENCOUNTER — Ambulatory Visit
Admission: RE | Admit: 2019-08-03 | Discharge: 2019-08-03 | Disposition: A | Discharge status: Home / Self Care | Payer: Medicare HMO | Source: Ambulatory Visit | Attending: Family Medicine | Admitting: Family Medicine

## 2019-08-03 ENCOUNTER — Other Ambulatory Visit: Payer: Self-pay | Admitting: Family Medicine

## 2019-08-03 ENCOUNTER — Other Ambulatory Visit: Payer: Self-pay

## 2019-08-03 DIAGNOSIS — N63 Unspecified lump in unspecified breast: Secondary | ICD-10-CM

## 2019-08-03 DIAGNOSIS — N6313 Unspecified lump in the right breast, lower outer quadrant: Secondary | ICD-10-CM | POA: Diagnosis not present

## 2019-08-03 DIAGNOSIS — N6315 Unspecified lump in the right breast, overlapping quadrants: Secondary | ICD-10-CM | POA: Diagnosis not present

## 2019-08-03 DIAGNOSIS — C50811 Malignant neoplasm of overlapping sites of right female breast: Secondary | ICD-10-CM | POA: Diagnosis not present

## 2019-08-03 DIAGNOSIS — R590 Localized enlarged lymph nodes: Secondary | ICD-10-CM | POA: Diagnosis not present

## 2019-08-06 ENCOUNTER — Ambulatory Visit: Payer: Self-pay | Admitting: General Surgery

## 2019-08-06 DIAGNOSIS — Z170 Estrogen receptor positive status [ER+]: Secondary | ICD-10-CM | POA: Diagnosis not present

## 2019-08-06 DIAGNOSIS — C50111 Malignant neoplasm of central portion of right female breast: Secondary | ICD-10-CM | POA: Diagnosis not present

## 2019-08-07 ENCOUNTER — Telehealth: Payer: Self-pay | Admitting: Radiation Oncology

## 2019-08-07 ENCOUNTER — Telehealth: Payer: Self-pay | Admitting: Hematology and Oncology

## 2019-08-07 NOTE — Telephone Encounter (Signed)
New Message:    Called patients son and he states she has yet to be approved for her surgery and he will call back on Monday to schedule.

## 2019-08-07 NOTE — Telephone Encounter (Signed)
Received a new patient referral from Dr. Marlou Starks fro a new dx of breast cancer. I cld and spoke to her son to schedule an appt w/Dr. Lindi Adie on 9/22 at 345pm. Gabriela Davis will need her son at the appt due to cognitive and inability to walk. Msg sent to Dr. Julien Nordmann for permission.

## 2019-08-10 NOTE — Progress Notes (Signed)
[facility] CONSULT NOTE  Patient Care Team: Denita Lung, MD as PCP - General (Family Medicine) Mauro Kaufmann, RN as Oncology Nurse Navigator Rockwell Germany, RN as Oncology Nurse Navigator  CHIEF COMPLAINTS/PURPOSE OF CONSULTATION:  Newly diagnosed breast cancer  HISTORY OF PRESENTING ILLNESS:  Gabriela Davis 78 y.o. female is here because of recent diagnosis of invasive ductal carcinoma of the right breast. The patient palpated a right breast lump for 2 months. Diagnostic mammogram on 07/22/19 showed an irregular 6.7cm mass in the right breast at the 3:00 position and a lymph node with focal cortical thickening in the right axilla measuring up to 41m. Biopsy on 08/03/19 showed grade 2 invasive ductal carcinoma, HER-2 negative (1+), ER+ 100%, PR+ 90%, Ki67 30%, with one lymph node negative for carcinoma. She presents to the clinic today for initial evalution and to discuss treatment options.   I reviewed her records extensively and collaborated the history with the patient.  SUMMARY OF ONCOLOGIC HISTORY: Oncology History  Malignant neoplasm of lower-inner quadrant of right breast of female, estrogen receptor positive ([place]  08/11/2019 Initial Diagnosis   Patient palpated a right breast lump for 2 months. Mammogram showed an irregular 6.7cm mass in the right breast at the 3:00 position and a lymph node with focal cortical thickening in the right axilla measuring up to 666m Biopsy showed grade 2 IDC, HER-2 - (1+), ER+ 100%, PR+ 90%, Ki67 30%, with one lymph node negative   08/11/2019 Cancer Staging   Staging form: Breast, AJCC 8th Edition - Clinical stage from 08/11/2019: Stage IIA (cT3, cN0, cM0, G2, ER+, PR+, HER2-) - Signed by GuNicholas LoseMD on 08/11/2019     MEDICAL HISTORY:  Past Medical History:  Diagnosis Date  . Arthritis   . Cardiomyopathy ([facility]  . COPD (chronic obstructive pulmonary disease) ([place]  . History of CVA (cerebrovascular accident)    No  residual  . Hypertension   . Stroke ([facility]   slight stroke in 2010 - no deficits noted  . Tobacco use disorder     SURGICAL HISTORY: Past Surgical History:  Procedure Laterality Date  . ABDOMINAL HYSTERECTOMY    . BREAST BIOPSY Right 2016   benign  . CHOLECYSTECTOMY    . EXCISION OF BACK LESION N/A 02/20/2016   Procedure: WIDE LOCAL EXCISION BACK LESION X 2 ;  Surgeon: ArRalene OkMD;  Location: [facility];  Service: General;  Laterality: N/A;  . KNEE SURGERY     x 2-3, L knee  . LAMINECTOMY     L4-5  . TONSILLECTOMY      SOCIAL HISTORY: Social History   Socioeconomic History  . Marital status: Single    Spouse name: Not on file  . Number of children: 2  . Years of education: Not on file  . Highest education level: Not on file  Occupational History  . Not on file  Social Needs  . Financial resource strain: Not on file  . Food insecurity    Worry: Not on file    Inability: Not on file  . Transportation needs    Medical: Not on file    Non-medical: Not on file  Tobacco Use  . Smoking status: Current Every Day Smoker    Packs/day: 0.50    Years: 43.00    Pack years: 21.50    Types: Cigarettes  . Smokeless tobacco: Never Used  Substance and Sexual Activity  . Alcohol use: Yes  Alcohol/week: 0.0 standard drinks    Comment: rare  . Drug use: No  . Sexual activity: Not Currently    Birth control/protection: Post-menopausal  Lifestyle  . Physical activity    Days per week: Not on file    Minutes per session: Not on file  . Stress: Not on file  Relationships  . Social Herbalist on phone: Not on file    Gets together: Not on file    Attends religious service: Not on file    Active member of club or organization: Not on file    Attends meetings of clubs or organizations: Not on file    Relationship status: Not on file  . Intimate partner violence    Fear of current or ex partner: Not on file    Emotionally abused: Not on file    Physically  abused: Not on file    Forced sexual activity: Not on file  Other Topics Concern  . Not on file  Social History Narrative   Lives with boyfriend.  Widow.     FAMILY HISTORY: Family History  Problem Relation Age of Onset  . CAD Father 58    ALLERGIES:  is allergic to cephalexin; morphine and related; other; and penicillins.  MEDICATIONS:  Current Outpatient Medications  Medication Sig Dispense Refill  . aspirin 81 MG tablet Take 81 mg by mouth daily with lunch.      No current facility-administered medications for this visit.    Facility-Administered Medications Ordered in Other Visits  Medication Dose Route Frequency Provider Last Rate Last Dose  . influenza  inactive virus vaccine (FLUZONE/FLUARIX) injection 0.5 mL  0.5 mL Intramuscular Once Denita Lung, MD        REVIEW OF SYSTEMS:   Constitutional: Denies fevers, chills or abnormal night sweats Eyes: Denies blurriness of vision, double vision or watery eyes Ears, nose, mouth, throat, and face: Denies mucositis or sore throat Respiratory: Denies cough, dyspnea or wheezes Cardiovascular: Denies palpitation, chest discomfort or lower extremity swelling Gastrointestinal:  Denies nausea, heartburn or change in bowel habits Skin: Denies abnormal skin rashes Lymphatics: Denies new lymphadenopathy or easy bruising Neurological:Denies numbness, tingling or new weaknesses Behavioral/Psych: Mood is stable, no new changes  Breast: Palpable right breast mass with biopsy changes and bruising and bleeding All other systems were reviewed with the patient and are negative.  PHYSICAL EXAMINATION: ECOG PERFORMANCE STATUS: 3 - Symptomatic, >50% confined to bed  Vitals:   08/11/19 1557  BP: (!) 149/84  Pulse: 70  Resp: 17  Temp: 98.2 F (36.8 C)  SpO2: 93%   Filed Weights   08/11/19 1557  Weight: 178 lb 14.4 oz (81.1 kg)    GENERAL:alert, no distress and comfortable SKIN: skin color, texture, turgor are normal, no rashes  or significant lesions EYES: normal, conjunctiva are pink and non-injected, sclera clear OROPHARYNX:no exudate, no erythema and lips, buccal mucosa, and tongue normal  NECK: supple, thyroid normal size, non-tender, without nodularity LYMPH:  no palpable lymphadenopathy in the cervical, axillary or inguinal LUNGS: clear to auscultation and percussion with normal breathing effort HEART: regular rate & rhythm and no murmurs and no lower extremity edema ABDOMEN:abdomen soft, non-tender and normal bowel sounds Musculoskeletal:no cyanosis of digits and no clubbing  PSYCH: alert & oriented x 3 with fluent speech NEURO: no focal motor/sensory deficits BREAST: Large palpable lump in the right breast within breast lymphedema and bleeding from the biopsy site. No palpable axillary or supraclavicular lymphadenopathy (exam performed  in the presence of a chaperone)   LABORATORY DATA:  I have reviewed the data as listed Lab Results  Component Value Date   WBC 4.6 07/09/2019   HGB 12.2 07/09/2019   HCT 35.6 07/09/2019   MCV 99 (H) 07/09/2019   PLT 178 07/09/2019   Lab Results  Component Value Date   NA 139 07/09/2019   K 4.8 07/09/2019   CL 99 07/09/2019   CO2 28 07/09/2019    RADIOGRAPHIC STUDIES: I have personally reviewed the radiological reports and agreed with the findings in the report.  ASSESSMENT AND PLAN:  Malignant neoplasm of lower-inner quadrant of right breast of female, estrogen receptor positive ([place]) 07/22/2019: Evaluation of palpable area of concern right breast for 2 months, mammogram revealed irregular 5 cm mass, ultrasound revealed 6.7 x 4.1 x 4.7 cm mass, right axillary lymph node with cortical thickening 6 mm Right breast biopsy 3:00: Grade 2 IDC, ER 100%, PR 90%, Ki-67 30%, HER-2 -1+, lymph node right axilla biopsy: Benign T3N0 stage IIa clinical stage  Pathology and radiology counseling:Discussed with the patient, the details of pathology including the type of breast  cancer,the clinical staging, the significance of ER, PR and HER-2/neu receptors and the implications for treatment. After reviewing the pathology in detail, we proceeded to discuss the different treatment options between surgery, radiation, chemotherapy, antiestrogen therapies.  Recommendations: 1. Breast conserving surgery followed by 2.  Adjuvant radiation therapy may or may not be a good idea given the fact that she has such poor performance status.  She has a hard time even walking to the front of the clinic.  Her son is not in favor of radiation but is willing to see what the recommendations are.  She to does not want to do radiation. 3. Adjuvant antiestrogen therapy with anastrozole 1 mg daily x5 to 7 years   Return to clinic after surgery to discuss final pathology report and to discuss adjuvant plan.  All questions were answered. The patient knows to call the clinic with any problems, questions or concerns.   Rulon Eisenmenger, MD 08/11/2019    I, Molly Dorshimer, am acting as scribe for Nicholas Lose, MD.  I have reviewed the above documentation for accuracy and completeness, and I agree with the above.

## 2019-08-11 ENCOUNTER — Other Ambulatory Visit: Payer: Self-pay

## 2019-08-11 ENCOUNTER — Encounter (INDEPENDENT_AMBULATORY_CARE_PROVIDER_SITE_OTHER): Payer: Self-pay

## 2019-08-11 ENCOUNTER — Inpatient Hospital Stay: Payer: Medicare HMO | Attending: Hematology and Oncology | Admitting: Hematology and Oncology

## 2019-08-11 DIAGNOSIS — Z8673 Personal history of transient ischemic attack (TIA), and cerebral infarction without residual deficits: Secondary | ICD-10-CM | POA: Diagnosis not present

## 2019-08-11 DIAGNOSIS — J449 Chronic obstructive pulmonary disease, unspecified: Secondary | ICD-10-CM | POA: Diagnosis not present

## 2019-08-11 DIAGNOSIS — C50211 Malignant neoplasm of upper-inner quadrant of right female breast: Secondary | ICD-10-CM | POA: Insufficient documentation

## 2019-08-11 DIAGNOSIS — F17210 Nicotine dependence, cigarettes, uncomplicated: Secondary | ICD-10-CM | POA: Insufficient documentation

## 2019-08-11 DIAGNOSIS — Z7982 Long term (current) use of aspirin: Secondary | ICD-10-CM | POA: Insufficient documentation

## 2019-08-11 DIAGNOSIS — C50311 Malignant neoplasm of lower-inner quadrant of right female breast: Secondary | ICD-10-CM | POA: Diagnosis not present

## 2019-08-11 DIAGNOSIS — I429 Cardiomyopathy, unspecified: Secondary | ICD-10-CM | POA: Insufficient documentation

## 2019-08-11 DIAGNOSIS — M129 Arthropathy, unspecified: Secondary | ICD-10-CM | POA: Insufficient documentation

## 2019-08-11 DIAGNOSIS — Z170 Estrogen receptor positive status [ER+]: Secondary | ICD-10-CM | POA: Insufficient documentation

## 2019-08-11 DIAGNOSIS — I10 Essential (primary) hypertension: Secondary | ICD-10-CM | POA: Diagnosis not present

## 2019-08-11 DIAGNOSIS — C773 Secondary and unspecified malignant neoplasm of axilla and upper limb lymph nodes: Secondary | ICD-10-CM | POA: Insufficient documentation

## 2019-08-11 NOTE — Assessment & Plan Note (Addendum)
07/22/2019: Evaluation of palpable area of concern right breast for 2 months, mammogram revealed irregular 5 cm mass, ultrasound revealed 6.7 x 4.1 x 4.7 cm mass, right axillary lymph node with cortical thickening 6 mm Right breast biopsy 3:00: Grade 2 IDC, ER 100%, PR 90%, Ki-67 30%, HER-2 -1+, lymph node right axilla biopsy: Benign T3N0 stage IIa clinical stage  Pathology and radiology counseling:Discussed with the patient, the details of pathology including the type of breast cancer,the clinical staging, the significance of ER, PR and HER-2/neu receptors and the implications for treatment. After reviewing the pathology in detail, we proceeded to discuss the different treatment options between surgery, radiation, chemotherapy, antiestrogen therapies.  Recommendations: 1. Breast conserving surgery followed by 2.  Molecular testing with MammaPrint if the lymph node is positive and Oncotype if lymph node is negative 3. Adjuvant radiation therapy followed by 4. Adjuvant antiestrogen therapy  Oncotype counseling: I discussed Oncotype DX test. I explained to the patient that this is a 21 gene panel to evaluate patient tumors DNA to calculate recurrence score. This would help determine whether patient has high risk or intermediate risk or low risk breast cancer. She understands that if her tumor was found to be high risk, she would benefit from systemic chemotherapy. If low risk, no need of chemotherapy. If she was found to be intermediate risk, we would need to evaluate the score as well as other risk factors and determine if an abbreviated chemotherapy may be of benefit.  Return to clinic after surgery to discuss final pathology report and then determine if Oncotype DX testing will need to be sent.

## 2019-08-12 ENCOUNTER — Encounter: Payer: Self-pay | Admitting: *Deleted

## 2019-08-13 NOTE — Progress Notes (Signed)
Location of Breast Cancer: Right Breast  Histology per Pathology Report:  08/03/19 Diagnosis 1. Breast, right, needle core biopsy, 3 o'clock - INVASIVE DUCTAL CARCINOMA. - SEE COMMENT. 2. Lymph node, needle/core biopsy, right axilla - THERE IS NO EVIDENCE OF CARCINOMA IN 1 OF 1 LYMPH NODE (0/1).  Receptor Status: ER(100%), PR (90%), Her2-neu (NEG), Ki-(30%)  Did patient present with symptoms or was this found on screening mammography?: Gabriela Davis self- palpated a breast lump 2 months before bringing it to the attention of her doctor.   Past/Anticipated interventions by surgeon, if any: Dr. Marlou Starks consultation on 08/06/19. Surgery has not been scheduled at this time.   Past/Anticipated interventions by medical oncology, if any:  08/11/19 Dr. Lindi Adie Recommendations: 1. Breast conserving surgery followed by 2.  Adjuvant radiation therapy may or may not be a good idea given the fact that she has such poor performance status.  She has a hard time even walking to the front of the clinic.  Her son is not in favor of radiation but is willing to see what the recommendations are.  She to does not want to do radiation. 3. Adjuvant antiestrogen therapy with anastrozole 1 mg daily x5 to 7 years Return to clinic after surgery to discuss final pathology report and to discuss adjuvant plan. All questions were answered. The patient knows to call the clinic with any problems, questions or concerns.    Lymphedema issues, if any:  N/A  Pain issues, if any:  She reports pain to her right breast when she lays down at night.   SAFETY ISSUES:  Prior radiation? No  Pacemaker/ICD? No  Possible current pregnancy? No  Is the patient on methotrexate? No  Current Complaints / other details:      Gabriela Davis, Gabriela Police, RN 08/13/2019,9:30 AM

## 2019-08-18 NOTE — Progress Notes (Signed)
°Radiation Oncology         [phone] °________________________________ ° °Initial outpatient Consultation by phone due to pandemic precautions; patient unable to access WebEx  ° °Name: Gabriela Davis MRN: [id number]  °Date: 08/19/2019  DOB: 02/28/1941 ° °CC:Lalonde, John C, MD  Toth, Paul III, MD  ° °REFERRING PHYSICIAN: Toth, Paul III, MD ° °DIAGNOSIS:  °  ICD-10-CM   °1. Malignant neoplasm of lower-inner quadrant of right breast of female, estrogen receptor positive (HCC)  C50.311   ° Z17.0   ° °Stage IIA Right Breast UIQ Invasive Ductal Carcinoma, ER+ / PR+ / Her2-, Grade 2 °Cancer Staging °Malignant neoplasm of lower-inner quadrant of right breast of female, estrogen receptor positive (HCC) °Staging form: Breast, AJCC 8th Edition °- Clinical stage from 08/11/2019: Stage IIA (cT3, cN0, cM0, G2, ER+, PR+, HER2-) - Signed by Gudena, Vinay, MD on 08/11/2019 ° ° °CHIEF COMPLAINT: Here to discuss management of right breast cancer ° °HISTORY OF PRESENT ILLNESS::Gabriela Davis is a 78 y.o. female who presented with breast abnormality on the following imaging: diagnostic mammogram on the date of 07/22/2019.  Symptoms, if any, at that time, were: 2 month history of palpable right breast lump.   Ultrasound of breast the same day revealed 6.7 cm irregular mass at 3 o'clock with asuspicious 6mm right axillary lymph node.   Biopsy on date of 08/03/2019 showed invasive ductal carcinoma. Biopsied lymph node was benign (0/1).  ER status: 100%; PR status 90%, Her2 status negative; Grade 2. ° °She met with Dr. Gudena on 08/11/2019. He recommended breast conserving surgery followed by possible adjuvant radiation therapy, and then adjuvant antiestrogen therapy. He expressed concern about this patient undergoing radiation therapy given her poor performance status. She has been kindly referred to me for discussion of possible radiation treatment options. ° °She reports pain to her right breast when she lays down at night. She has a  history skin cancer of the back, removed. She does not have trouble lying flat. She can raise her right arm over her head. Her son is present on the call as well.  ° °She has many medical issues, and low stamina, ambulation issues, decreased generalized strength at baseline.  They anticipate mastectomy with Dr. Toth. ° °PREVIOUS RADIATION THERAPY: No ° °PAST MEDICAL HISTORY:  has a past medical history of Arthritis, Cardiomyopathy (HCC), COPD (chronic obstructive pulmonary disease) (HCC), History of CVA (cerebrovascular accident), Hypertension, Stroke (HCC), and Tobacco use disorder.   ° °PAST SURGICAL HISTORY: °Past Surgical History:  °Procedure Laterality Date  °• ABDOMINAL HYSTERECTOMY    °• BREAST BIOPSY Right 2016  ° benign  °• CHOLECYSTECTOMY    °• EXCISION OF BACK LESION N/A 02/20/2016  ° Procedure: WIDE LOCAL EXCISION BACK LESION X 2 ;  Surgeon: Armando Ramirez, MD;  Location: [facility];  Service: General;  Laterality: N/A;  °• KNEE SURGERY    ° x 2-3, L knee  °• LAMINECTOMY    ° L4-5  °• TONSILLECTOMY    ° ° °FAMILY HISTORY: family history includes CAD (age of onset: 74) in her father. ° °SOCIAL HISTORY:  reports that she has been smoking cigarettes. She has a 21.50 pack-year smoking history. She has never used smokeless tobacco. She reports current alcohol use. She reports that she does not use drugs. ° °ALLERGIES: Cephalexin, Morphine and related, Other, and Penicillins ° °MEDICATIONS:  °Current Outpatient Medications  °Medication Sig Dispense Refill  °• aspirin 81 MG tablet Take 81 mg by mouth daily with   °Radiation Oncology         [phone] °________________________________ ° °Initial outpatient Consultation by phone due to pandemic precautions; patient unable to access WebEx  ° °Name: Gabriela Davis MRN: [id number]  °Date: 08/19/2019  DOB: 02/28/1941 ° °CC:Lalonde, John C, MD  Toth, Paul III, MD  ° °REFERRING PHYSICIAN: Toth, Paul III, MD ° °DIAGNOSIS:  °  ICD-10-CM   °1. Malignant neoplasm of lower-inner quadrant of right breast of female, estrogen receptor positive (HCC)  C50.311   ° Z17.0   ° °Stage IIA Right Breast UIQ Invasive Ductal Carcinoma, ER+ / PR+ / Her2-, Grade 2 °Cancer Staging °Malignant neoplasm of lower-inner quadrant of right breast of female, estrogen receptor positive (HCC) °Staging form: Breast, AJCC 8th Edition °- Clinical stage from 08/11/2019: Stage IIA (cT3, cN0, cM0, G2, ER+, PR+, HER2-) - Signed by Gudena, Vinay, MD on 08/11/2019 ° ° °CHIEF COMPLAINT: Here to discuss management of right breast cancer ° °HISTORY OF PRESENT ILLNESS::Gabriela Davis is a 78 y.o. female who presented with breast abnormality on the following imaging: diagnostic mammogram on the date of 07/22/2019.  Symptoms, if any, at that time, were: 2 month history of palpable right breast lump.   Ultrasound of breast the same day revealed 6.7 cm irregular mass at 3 o'clock with asuspicious 6mm right axillary lymph node.   Biopsy on date of 08/03/2019 showed invasive ductal carcinoma. Biopsied lymph node was benign (0/1).  ER status: 100%; PR status 90%, Her2 status negative; Grade 2. ° °She met with Dr. Gudena on 08/11/2019. He recommended breast conserving surgery followed by possible adjuvant radiation therapy, and then adjuvant antiestrogen therapy. He expressed concern about this patient undergoing radiation therapy given her poor performance status. She has been kindly referred to me for discussion of possible radiation treatment options. ° °She reports pain to her right breast when she lays down at night. She has a  history skin cancer of the back, removed. She does not have trouble lying flat. She can raise her right arm over her head. Her son is present on the call as well.  ° °She has many medical issues, and low stamina, ambulation issues, decreased generalized strength at baseline.  They anticipate mastectomy with Dr. Toth. ° °PREVIOUS RADIATION THERAPY: No ° °PAST MEDICAL HISTORY:  has a past medical history of Arthritis, Cardiomyopathy (HCC), COPD (chronic obstructive pulmonary disease) (HCC), History of CVA (cerebrovascular accident), Hypertension, Stroke (HCC), and Tobacco use disorder.   ° °PAST SURGICAL HISTORY: °Past Surgical History:  °Procedure Laterality Date  °• ABDOMINAL HYSTERECTOMY    °• BREAST BIOPSY Right 2016  ° benign  °• CHOLECYSTECTOMY    °• EXCISION OF BACK LESION N/A 02/20/2016  ° Procedure: WIDE LOCAL EXCISION BACK LESION X 2 ;  Surgeon: Armando Ramirez, MD;  Location: [facility];  Service: General;  Laterality: N/A;  °• KNEE SURGERY    ° x 2-3, L knee  °• LAMINECTOMY    ° L4-5  °• TONSILLECTOMY    ° ° °FAMILY HISTORY: family history includes CAD (age of onset: 74) in her father. ° °SOCIAL HISTORY:  reports that she has been smoking cigarettes. She has a 21.50 pack-year smoking history. She has never used smokeless tobacco. She reports current alcohol use. She reports that she does not use drugs. ° °ALLERGIES: Cephalexin, Morphine and related, Other, and Penicillins ° °MEDICATIONS:  °Current Outpatient Medications  °Medication Sig Dispense Refill  °• aspirin 81 MG tablet Take 81 mg by mouth daily with   °Radiation Oncology         [phone] °________________________________ ° °Initial outpatient Consultation by phone due to pandemic precautions; patient unable to access WebEx  ° °Name: Gabriela Davis MRN: [id number]  °Date: 08/19/2019  DOB: 02/28/1941 ° °CC:Lalonde, John C, MD  Toth, Paul III, MD  ° °REFERRING PHYSICIAN: Toth, Paul III, MD ° °DIAGNOSIS:  °  ICD-10-CM   °1. Malignant neoplasm of lower-inner quadrant of right breast of female, estrogen receptor positive (HCC)  C50.311   ° Z17.0   ° °Stage IIA Right Breast UIQ Invasive Ductal Carcinoma, ER+ / PR+ / Her2-, Grade 2 °Cancer Staging °Malignant neoplasm of lower-inner quadrant of right breast of female, estrogen receptor positive (HCC) °Staging form: Breast, AJCC 8th Edition °- Clinical stage from 08/11/2019: Stage IIA (cT3, cN0, cM0, G2, ER+, PR+, HER2-) - Signed by Gudena, Vinay, MD on 08/11/2019 ° ° °CHIEF COMPLAINT: Here to discuss management of right breast cancer ° °HISTORY OF PRESENT ILLNESS::Gabriela Davis is a 78 y.o. female who presented with breast abnormality on the following imaging: diagnostic mammogram on the date of 07/22/2019.  Symptoms, if any, at that time, were: 2 month history of palpable right breast lump.   Ultrasound of breast the same day revealed 6.7 cm irregular mass at 3 o'clock with asuspicious 6mm right axillary lymph node.   Biopsy on date of 08/03/2019 showed invasive ductal carcinoma. Biopsied lymph node was benign (0/1).  ER status: 100%; PR status 90%, Her2 status negative; Grade 2. ° °She met with Dr. Gudena on 08/11/2019. He recommended breast conserving surgery followed by possible adjuvant radiation therapy, and then adjuvant antiestrogen therapy. He expressed concern about this patient undergoing radiation therapy given her poor performance status. She has been kindly referred to me for discussion of possible radiation treatment options. ° °She reports pain to her right breast when she lays down at night. She has a  history skin cancer of the back, removed. She does not have trouble lying flat. She can raise her right arm over her head. Her son is present on the call as well.  ° °She has many medical issues, and low stamina, ambulation issues, decreased generalized strength at baseline.  They anticipate mastectomy with Dr. Toth. ° °PREVIOUS RADIATION THERAPY: No ° °PAST MEDICAL HISTORY:  has a past medical history of Arthritis, Cardiomyopathy (HCC), COPD (chronic obstructive pulmonary disease) (HCC), History of CVA (cerebrovascular accident), Hypertension, Stroke (HCC), and Tobacco use disorder.   ° °PAST SURGICAL HISTORY: °Past Surgical History:  °Procedure Laterality Date  °• ABDOMINAL HYSTERECTOMY    °• BREAST BIOPSY Right 2016  ° benign  °• CHOLECYSTECTOMY    °• EXCISION OF BACK LESION N/A 02/20/2016  ° Procedure: WIDE LOCAL EXCISION BACK LESION X 2 ;  Surgeon: Armando Ramirez, MD;  Location: [facility];  Service: General;  Laterality: N/A;  °• KNEE SURGERY    ° x 2-3, L knee  °• LAMINECTOMY    ° L4-5  °• TONSILLECTOMY    ° ° °FAMILY HISTORY: family history includes CAD (age of onset: 74) in her father. ° °SOCIAL HISTORY:  reports that she has been smoking cigarettes. She has a 21.50 pack-year smoking history. She has never used smokeless tobacco. She reports current alcohol use. She reports that she does not use drugs. ° °ALLERGIES: Cephalexin, Morphine and related, Other, and Penicillins ° °MEDICATIONS:  °Current Outpatient Medications  °Medication Sig Dispense Refill  °• aspirin 81 MG tablet Take 81 mg by mouth daily with   °Radiation Oncology         [phone] °________________________________ ° °Initial outpatient Consultation by phone due to pandemic precautions; patient unable to access WebEx  ° °Name: Gabriela Davis MRN: [id number]  °Date: 08/19/2019  DOB: 02/28/1941 ° °CC:Lalonde, John C, MD  Toth, Paul III, MD  ° °REFERRING PHYSICIAN: Toth, Paul III, MD ° °DIAGNOSIS:  °  ICD-10-CM   °1. Malignant neoplasm of lower-inner quadrant of right breast of female, estrogen receptor positive (HCC)  C50.311   ° Z17.0   ° °Stage IIA Right Breast UIQ Invasive Ductal Carcinoma, ER+ / PR+ / Her2-, Grade 2 °Cancer Staging °Malignant neoplasm of lower-inner quadrant of right breast of female, estrogen receptor positive (HCC) °Staging form: Breast, AJCC 8th Edition °- Clinical stage from 08/11/2019: Stage IIA (cT3, cN0, cM0, G2, ER+, PR+, HER2-) - Signed by Gudena, Vinay, MD on 08/11/2019 ° ° °CHIEF COMPLAINT: Here to discuss management of right breast cancer ° °HISTORY OF PRESENT ILLNESS::Gabriela Davis is a 78 y.o. female who presented with breast abnormality on the following imaging: diagnostic mammogram on the date of 07/22/2019.  Symptoms, if any, at that time, were: 2 month history of palpable right breast lump.   Ultrasound of breast the same day revealed 6.7 cm irregular mass at 3 o'clock with asuspicious 6mm right axillary lymph node.   Biopsy on date of 08/03/2019 showed invasive ductal carcinoma. Biopsied lymph node was benign (0/1).  ER status: 100%; PR status 90%, Her2 status negative; Grade 2. ° °She met with Dr. Gudena on 08/11/2019. He recommended breast conserving surgery followed by possible adjuvant radiation therapy, and then adjuvant antiestrogen therapy. He expressed concern about this patient undergoing radiation therapy given her poor performance status. She has been kindly referred to me for discussion of possible radiation treatment options. ° °She reports pain to her right breast when she lays down at night. She has a  history skin cancer of the back, removed. She does not have trouble lying flat. She can raise her right arm over her head. Her son is present on the call as well.  ° °She has many medical issues, and low stamina, ambulation issues, decreased generalized strength at baseline.  They anticipate mastectomy with Dr. Toth. ° °PREVIOUS RADIATION THERAPY: No ° °PAST MEDICAL HISTORY:  has a past medical history of Arthritis, Cardiomyopathy (HCC), COPD (chronic obstructive pulmonary disease) (HCC), History of CVA (cerebrovascular accident), Hypertension, Stroke (HCC), and Tobacco use disorder.   ° °PAST SURGICAL HISTORY: °Past Surgical History:  °Procedure Laterality Date  °• ABDOMINAL HYSTERECTOMY    °• BREAST BIOPSY Right 2016  ° benign  °• CHOLECYSTECTOMY    °• EXCISION OF BACK LESION N/A 02/20/2016  ° Procedure: WIDE LOCAL EXCISION BACK LESION X 2 ;  Surgeon: Armando Ramirez, MD;  Location: [facility];  Service: General;  Laterality: N/A;  °• KNEE SURGERY    ° x 2-3, L knee  °• LAMINECTOMY    ° L4-5  °• TONSILLECTOMY    ° ° °FAMILY HISTORY: family history includes CAD (age of onset: 74) in her father. ° °SOCIAL HISTORY:  reports that she has been smoking cigarettes. She has a 21.50 pack-year smoking history. She has never used smokeless tobacco. She reports current alcohol use. She reports that she does not use drugs. ° °ALLERGIES: Cephalexin, Morphine and related, Other, and Penicillins ° °MEDICATIONS:  °Current Outpatient Medications  °Medication Sig Dispense Refill  °• aspirin 81 MG tablet Take 81 mg by mouth daily with   °Radiation Oncology         [phone] °________________________________ ° °Initial outpatient Consultation by phone due to pandemic precautions; patient unable to access WebEx  ° °Name: Gabriela Davis MRN: [id number]  °Date: 08/19/2019  DOB: 02/28/1941 ° °CC:Lalonde, John C, MD  Toth, Paul III, MD  ° °REFERRING PHYSICIAN: Toth, Paul III, MD ° °DIAGNOSIS:  °  ICD-10-CM   °1. Malignant neoplasm of lower-inner quadrant of right breast of female, estrogen receptor positive (HCC)  C50.311   ° Z17.0   ° °Stage IIA Right Breast UIQ Invasive Ductal Carcinoma, ER+ / PR+ / Her2-, Grade 2 °Cancer Staging °Malignant neoplasm of lower-inner quadrant of right breast of female, estrogen receptor positive (HCC) °Staging form: Breast, AJCC 8th Edition °- Clinical stage from 08/11/2019: Stage IIA (cT3, cN0, cM0, G2, ER+, PR+, HER2-) - Signed by Gudena, Vinay, MD on 08/11/2019 ° ° °CHIEF COMPLAINT: Here to discuss management of right breast cancer ° °HISTORY OF PRESENT ILLNESS::Gabriela Davis is a 78 y.o. female who presented with breast abnormality on the following imaging: diagnostic mammogram on the date of 07/22/2019.  Symptoms, if any, at that time, were: 2 month history of palpable right breast lump.   Ultrasound of breast the same day revealed 6.7 cm irregular mass at 3 o'clock with asuspicious 6mm right axillary lymph node.   Biopsy on date of 08/03/2019 showed invasive ductal carcinoma. Biopsied lymph node was benign (0/1).  ER status: 100%; PR status 90%, Her2 status negative; Grade 2. ° °She met with Dr. Gudena on 08/11/2019. He recommended breast conserving surgery followed by possible adjuvant radiation therapy, and then adjuvant antiestrogen therapy. He expressed concern about this patient undergoing radiation therapy given her poor performance status. She has been kindly referred to me for discussion of possible radiation treatment options. ° °She reports pain to her right breast when she lays down at night. She has a  history skin cancer of the back, removed. She does not have trouble lying flat. She can raise her right arm over her head. Her son is present on the call as well.  ° °She has many medical issues, and low stamina, ambulation issues, decreased generalized strength at baseline.  They anticipate mastectomy with Dr. Toth. ° °PREVIOUS RADIATION THERAPY: No ° °PAST MEDICAL HISTORY:  has a past medical history of Arthritis, Cardiomyopathy (HCC), COPD (chronic obstructive pulmonary disease) (HCC), History of CVA (cerebrovascular accident), Hypertension, Stroke (HCC), and Tobacco use disorder.   ° °PAST SURGICAL HISTORY: °Past Surgical History:  °Procedure Laterality Date  °• ABDOMINAL HYSTERECTOMY    °• BREAST BIOPSY Right 2016  ° benign  °• CHOLECYSTECTOMY    °• EXCISION OF BACK LESION N/A 02/20/2016  ° Procedure: WIDE LOCAL EXCISION BACK LESION X 2 ;  Surgeon: Armando Ramirez, MD;  Location: [facility];  Service: General;  Laterality: N/A;  °• KNEE SURGERY    ° x 2-3, L knee  °• LAMINECTOMY    ° L4-5  °• TONSILLECTOMY    ° ° °FAMILY HISTORY: family history includes CAD (age of onset: 74) in her father. ° °SOCIAL HISTORY:  reports that she has been smoking cigarettes. She has a 21.50 pack-year smoking history. She has never used smokeless tobacco. She reports current alcohol use. She reports that she does not use drugs. ° °ALLERGIES: Cephalexin, Morphine and related, Other, and Penicillins ° °MEDICATIONS:  °Current Outpatient Medications  °Medication Sig Dispense Refill  °• aspirin 81 MG tablet Take 81 mg by mouth daily with

## 2019-08-19 ENCOUNTER — Other Ambulatory Visit: Payer: Self-pay

## 2019-08-19 ENCOUNTER — Ambulatory Visit
Admission: RE | Admit: 2019-08-19 | Discharge: 2019-08-19 | Disposition: A | Discharge status: Home / Self Care | Payer: Medicare HMO | Source: Ambulatory Visit | Attending: Radiation Oncology | Admitting: Radiation Oncology

## 2019-08-19 ENCOUNTER — Encounter: Payer: Self-pay | Admitting: Radiation Oncology

## 2019-08-19 DIAGNOSIS — C50311 Malignant neoplasm of lower-inner quadrant of right female breast: Secondary | ICD-10-CM

## 2019-08-19 DIAGNOSIS — Z170 Estrogen receptor positive status [ER+]: Secondary | ICD-10-CM

## 2019-08-27 ENCOUNTER — Ambulatory Visit: Payer: Medicare HMO | Admitting: Cardiology

## 2019-08-31 ENCOUNTER — Encounter: Payer: Self-pay | Admitting: *Deleted

## 2019-09-09 ENCOUNTER — Ambulatory Visit: Payer: Medicare HMO | Admitting: Cardiology

## 2019-09-09 ENCOUNTER — Encounter: Payer: Self-pay | Admitting: *Deleted

## 2019-09-14 ENCOUNTER — Telehealth: Payer: Self-pay | Admitting: *Deleted

## 2019-09-14 NOTE — Telephone Encounter (Signed)
Called and spoke to son [name] about pt taking AI while she is deciding what she will agree to as for treatment. [name] will work with pt PCP to see if she will agree to AI and will call us. Discussed symptoms she may experience. [name] will call with pt decision.

## 2019-09-21 ENCOUNTER — Telehealth: Payer: Self-pay | Admitting: *Deleted

## 2019-09-21 NOTE — Telephone Encounter (Signed)
Spoke to son [name] regarding sending in AI to pharmacy for pt. [name] will speak to pt and ask the pharmacy she prefers and will call back.

## 2019-09-23 ENCOUNTER — Telehealth: Payer: Self-pay

## 2019-09-23 MED ORDER — ANASTROZOLE 1 MG PO TABS: 1.0000 mg | ORAL_TABLET | Freq: Every day | ORAL | 0 refills | Status: DC

## 2019-09-23 NOTE — Telephone Encounter (Signed)
RN spoke with patient's son, [name].  Requesting AI be sent to pharmacy.  Per MD recommendations patient to start on Anastrozole 1mg  daily.    RN educated son on side effects of medication, voiced understanding.  Rx sent to pharmacy.

## 2019-09-24 ENCOUNTER — Ambulatory Visit: Payer: Medicare HMO | Admitting: Cardiology

## 2019-09-24 ENCOUNTER — Encounter: Payer: Self-pay | Admitting: *Deleted

## 2019-09-30 ENCOUNTER — Ambulatory Visit: Payer: Medicare HMO | Admitting: Cardiovascular Disease

## 2019-10-01 ENCOUNTER — Encounter: Payer: Self-pay | Admitting: *Deleted

## 2019-10-02 ENCOUNTER — Encounter (HOSPITAL_COMMUNITY): Payer: Self-pay

## 2019-10-02 ENCOUNTER — Inpatient Hospital Stay (HOSPITAL_COMMUNITY)
Admission: EM | Admit: 2019-10-02 | Discharge: 2019-10-20 | DRG: 177 | Disposition: E | Payer: Medicare HMO | Attending: Student | Admitting: Student

## 2019-10-02 ENCOUNTER — Emergency Department (HOSPITAL_COMMUNITY): Payer: Medicare HMO

## 2019-10-02 ENCOUNTER — Inpatient Hospital Stay (HOSPITAL_COMMUNITY): Payer: Medicare HMO

## 2019-10-02 ENCOUNTER — Other Ambulatory Visit: Payer: Self-pay

## 2019-10-02 DIAGNOSIS — R410 Disorientation, unspecified: Secondary | ICD-10-CM | POA: Diagnosis not present

## 2019-10-02 DIAGNOSIS — J189 Pneumonia, unspecified organism: Secondary | ICD-10-CM | POA: Diagnosis not present

## 2019-10-02 DIAGNOSIS — R21 Rash and other nonspecific skin eruption: Secondary | ICD-10-CM | POA: Diagnosis not present

## 2019-10-02 DIAGNOSIS — D638 Anemia in other chronic diseases classified elsewhere: Secondary | ICD-10-CM | POA: Diagnosis present

## 2019-10-02 DIAGNOSIS — N179 Acute kidney failure, unspecified: Secondary | ICD-10-CM | POA: Diagnosis present

## 2019-10-02 DIAGNOSIS — I4891 Unspecified atrial fibrillation: Secondary | ICD-10-CM | POA: Diagnosis not present

## 2019-10-02 DIAGNOSIS — I63531 Cerebral infarction due to unspecified occlusion or stenosis of right posterior cerebral artery: Secondary | ICD-10-CM | POA: Diagnosis not present

## 2019-10-02 DIAGNOSIS — Z66 Do not resuscitate: Secondary | ICD-10-CM | POA: Diagnosis not present

## 2019-10-02 DIAGNOSIS — N390 Urinary tract infection, site not specified: Secondary | ICD-10-CM | POA: Diagnosis present

## 2019-10-02 DIAGNOSIS — C50919 Malignant neoplasm of unspecified site of unspecified female breast: Secondary | ICD-10-CM

## 2019-10-02 DIAGNOSIS — G9340 Encephalopathy, unspecified: Secondary | ICD-10-CM | POA: Diagnosis not present

## 2019-10-02 DIAGNOSIS — C50311 Malignant neoplasm of lower-inner quadrant of right female breast: Secondary | ICD-10-CM | POA: Diagnosis present

## 2019-10-02 DIAGNOSIS — R918 Other nonspecific abnormal finding of lung field: Secondary | ICD-10-CM | POA: Diagnosis not present

## 2019-10-02 DIAGNOSIS — J690 Pneumonitis due to inhalation of food and vomit: Secondary | ICD-10-CM | POA: Diagnosis not present

## 2019-10-02 DIAGNOSIS — I428 Other cardiomyopathies: Secondary | ICD-10-CM | POA: Diagnosis not present

## 2019-10-02 DIAGNOSIS — Z20828 Contact with and (suspected) exposure to other viral communicable diseases: Secondary | ICD-10-CM | POA: Diagnosis not present

## 2019-10-02 DIAGNOSIS — R590 Localized enlarged lymph nodes: Secondary | ICD-10-CM | POA: Diagnosis not present

## 2019-10-02 DIAGNOSIS — F17210 Nicotine dependence, cigarettes, uncomplicated: Secondary | ICD-10-CM | POA: Diagnosis present

## 2019-10-02 DIAGNOSIS — J90 Pleural effusion, not elsewhere classified: Secondary | ICD-10-CM | POA: Diagnosis not present

## 2019-10-02 DIAGNOSIS — J440 Chronic obstructive pulmonary disease with acute lower respiratory infection: Secondary | ICD-10-CM | POA: Diagnosis present

## 2019-10-02 DIAGNOSIS — F0390 Unspecified dementia without behavioral disturbance: Secondary | ICD-10-CM | POA: Diagnosis present

## 2019-10-02 DIAGNOSIS — F17200 Nicotine dependence, unspecified, uncomplicated: Secondary | ICD-10-CM

## 2019-10-02 DIAGNOSIS — R911 Solitary pulmonary nodule: Secondary | ICD-10-CM | POA: Diagnosis not present

## 2019-10-02 DIAGNOSIS — E875 Hyperkalemia: Secondary | ICD-10-CM | POA: Diagnosis present

## 2019-10-02 DIAGNOSIS — C7802 Secondary malignant neoplasm of left lung: Secondary | ICD-10-CM | POA: Diagnosis present

## 2019-10-02 DIAGNOSIS — R0902 Hypoxemia: Secondary | ICD-10-CM | POA: Diagnosis not present

## 2019-10-02 DIAGNOSIS — I10 Essential (primary) hypertension: Secondary | ICD-10-CM | POA: Diagnosis present

## 2019-10-02 DIAGNOSIS — I63511 Cerebral infarction due to unspecified occlusion or stenosis of right middle cerebral artery: Secondary | ICD-10-CM | POA: Diagnosis not present

## 2019-10-02 DIAGNOSIS — R4182 Altered mental status, unspecified: Secondary | ICD-10-CM

## 2019-10-02 DIAGNOSIS — Z515 Encounter for palliative care: Secondary | ICD-10-CM | POA: Diagnosis not present

## 2019-10-02 DIAGNOSIS — I63533 Cerebral infarction due to unspecified occlusion or stenosis of bilateral posterior cerebral arteries: Secondary | ICD-10-CM | POA: Diagnosis not present

## 2019-10-02 DIAGNOSIS — C50911 Malignant neoplasm of unspecified site of right female breast: Secondary | ICD-10-CM | POA: Diagnosis not present

## 2019-10-02 DIAGNOSIS — R7989 Other specified abnormal findings of blood chemistry: Secondary | ICD-10-CM | POA: Diagnosis not present

## 2019-10-02 DIAGNOSIS — C50912 Malignant neoplasm of unspecified site of left female breast: Secondary | ICD-10-CM | POA: Diagnosis not present

## 2019-10-02 DIAGNOSIS — R52 Pain, unspecified: Secondary | ICD-10-CM | POA: Diagnosis not present

## 2019-10-02 DIAGNOSIS — I491 Atrial premature depolarization: Secondary | ICD-10-CM | POA: Diagnosis present

## 2019-10-02 DIAGNOSIS — Z853 Personal history of malignant neoplasm of breast: Secondary | ICD-10-CM | POA: Diagnosis not present

## 2019-10-02 DIAGNOSIS — Z8673 Personal history of transient ischemic attack (TIA), and cerebral infarction without residual deficits: Secondary | ICD-10-CM

## 2019-10-02 DIAGNOSIS — I63532 Cerebral infarction due to unspecified occlusion or stenosis of left posterior cerebral artery: Secondary | ICD-10-CM | POA: Diagnosis not present

## 2019-10-02 DIAGNOSIS — G8194 Hemiplegia, unspecified affecting left nondominant side: Secondary | ICD-10-CM | POA: Diagnosis not present

## 2019-10-02 DIAGNOSIS — R5381 Other malaise: Secondary | ICD-10-CM | POA: Diagnosis not present

## 2019-10-02 DIAGNOSIS — G9341 Metabolic encephalopathy: Secondary | ICD-10-CM | POA: Diagnosis present

## 2019-10-02 DIAGNOSIS — J9601 Acute respiratory failure with hypoxia: Secondary | ICD-10-CM | POA: Diagnosis not present

## 2019-10-02 DIAGNOSIS — R404 Transient alteration of awareness: Secondary | ICD-10-CM | POA: Diagnosis not present

## 2019-10-02 DIAGNOSIS — Z881 Allergy status to other antibiotic agents status: Secondary | ICD-10-CM

## 2019-10-02 DIAGNOSIS — R0602 Shortness of breath: Secondary | ICD-10-CM | POA: Diagnosis not present

## 2019-10-02 DIAGNOSIS — Z170 Estrogen receptor positive status [ER+]: Secondary | ICD-10-CM

## 2019-10-02 DIAGNOSIS — C773 Secondary and unspecified malignant neoplasm of axilla and upper limb lymph nodes: Secondary | ICD-10-CM | POA: Diagnosis not present

## 2019-10-02 DIAGNOSIS — R609 Edema, unspecified: Secondary | ICD-10-CM | POA: Diagnosis not present

## 2019-10-02 DIAGNOSIS — Z8249 Family history of ischemic heart disease and other diseases of the circulatory system: Secondary | ICD-10-CM

## 2019-10-02 DIAGNOSIS — Z79811 Long term (current) use of aromatase inhibitors: Secondary | ICD-10-CM

## 2019-10-02 DIAGNOSIS — Z885 Allergy status to narcotic agent status: Secondary | ICD-10-CM

## 2019-10-02 DIAGNOSIS — Z7189 Other specified counseling: Secondary | ICD-10-CM | POA: Diagnosis not present

## 2019-10-02 DIAGNOSIS — Z880 Allergy status to penicillin: Secondary | ICD-10-CM

## 2019-10-02 LAB — CBC WITH DIFFERENTIAL/PLATELET
Abs Immature Granulocytes: 0.07 10*3/uL (ref 0.00–0.07)
Basophils Absolute: 0 10*3/uL (ref 0.0–0.1)
Basophils Relative: 0 %
Eosinophils Absolute: 0 10*3/uL (ref 0.0–0.5)
Eosinophils Relative: 0 %
HCT: 42.3 % (ref 36.0–46.0)
Hemoglobin: 12.3 g/dL (ref 12.0–15.0)
Immature Granulocytes: 1 %
Lymphocytes Relative: 10 %
Lymphs Abs: 0.6 10*3/uL — ABNORMAL LOW (ref 0.7–4.0)
MCH: 34.4 pg — ABNORMAL HIGH (ref 26.0–34.0)
MCHC: 29.1 g/dL — ABNORMAL LOW (ref 30.0–36.0)
MCV: 118.2 fL — ABNORMAL HIGH (ref 80.0–100.0)
Monocytes Absolute: 0.4 10*3/uL (ref 0.1–1.0)
Monocytes Relative: 7 %
Neutro Abs: 4.5 10*3/uL (ref 1.7–7.7)
Neutrophils Relative %: 82 %
Platelets: 174 10*3/uL (ref 150–400)
RBC: 3.58 MIL/uL — ABNORMAL LOW (ref 3.87–5.11)
RDW: 17.9 % — ABNORMAL HIGH (ref 11.5–15.5)
WBC: 5.5 10*3/uL (ref 4.0–10.5)
nRBC: 0.4 % — ABNORMAL HIGH (ref 0.0–0.2)

## 2019-10-02 LAB — URINALYSIS, ROUTINE W REFLEX MICROSCOPIC
Glucose, UA: NEGATIVE mg/dL
Hgb urine dipstick: NEGATIVE
Ketones, ur: NEGATIVE mg/dL
Nitrite: POSITIVE — AB
Protein, ur: 30 mg/dL — AB
Specific Gravity, Urine: 1.018 (ref 1.005–1.030)
pH: 5 (ref 5.0–8.0)

## 2019-10-02 LAB — COMPREHENSIVE METABOLIC PANEL
ALT: 11 U/L (ref 0–44)
AST: 15 U/L (ref 15–41)
Albumin: 2.9 g/dL — ABNORMAL LOW (ref 3.5–5.0)
Alkaline Phosphatase: 96 U/L (ref 38–126)
Anion gap: 8 (ref 5–15)
BUN: 43 mg/dL — ABNORMAL HIGH (ref 8–23)
CO2: 29 mmol/L (ref 22–32)
Calcium: 7.8 mg/dL — ABNORMAL LOW (ref 8.9–10.3)
Chloride: 99 mmol/L (ref 98–111)
Creatinine, Ser: 1.2 mg/dL — ABNORMAL HIGH (ref 0.44–1.00)
GFR calc Af Amer: 50 mL/min — ABNORMAL LOW (ref >60)
GFR calc non Af Amer: 44 mL/min — ABNORMAL LOW (ref >60)
Glucose, Bld: 106 mg/dL — ABNORMAL HIGH (ref 70–99)
Potassium: 5.2 mmol/L — ABNORMAL HIGH (ref 3.5–5.1)
Sodium: 136 mmol/L (ref 135–145)
Total Bilirubin: 1.8 mg/dL — ABNORMAL HIGH (ref 0.3–1.2)
Total Protein: 6.5 g/dL (ref 6.5–8.1)

## 2019-10-02 LAB — LACTIC ACID, PLASMA
Lactic Acid, Venous: 1.4 mmol/L (ref 0.5–1.9)
Lactic Acid, Venous: 1.6 mmol/L (ref 0.5–1.9)

## 2019-10-02 LAB — PROTIME-INR
INR: 1.1 (ref 0.8–1.2)
Prothrombin Time: 14.3 seconds (ref 11.4–15.2)

## 2019-10-02 LAB — SARS CORONAVIRUS 2 (TAT 6-24 HRS): SARS Coronavirus 2: NEGATIVE

## 2019-10-02 MED ORDER — BUDESONIDE 0.5 MG/2ML IN SUSP
0.5000 mg | Freq: Two times a day (BID) | RESPIRATORY_TRACT | Status: DC
  Administered 2019-10-02 – 2019-10-03 (×2): 0.5 mg via RESPIRATORY_TRACT
  Filled 2019-10-02 (×2): qty 2

## 2019-10-02 MED ORDER — IOHEXOL 350 MG/ML SOLN
100.0000 mL | Freq: Once | INTRAVENOUS | Status: AC | PRN
  Administered 2019-10-02: 80 mL via INTRAVENOUS

## 2019-10-02 MED ORDER — IPRATROPIUM-ALBUTEROL 0.5-2.5 (3) MG/3ML IN SOLN
3.0000 mL | Freq: Four times a day (QID) | RESPIRATORY_TRACT | Status: DC
  Administered 2019-10-02 – 2019-10-03 (×3): 3 mL via RESPIRATORY_TRACT
  Filled 2019-10-02 (×3): qty 3

## 2019-10-02 MED ORDER — LEVOFLOXACIN IN D5W 750 MG/150ML IV SOLN: 750.0000 mg | INTRAVENOUS | Status: DC

## 2019-10-02 MED ORDER — NYSTATIN-TRIAMCINOLONE 100000-0.1 UNIT/GM-% EX CREA
TOPICAL_CREAM | Freq: Two times a day (BID) | CUTANEOUS | Status: DC
  Administered 2019-10-02 – 2019-10-03 (×2): via TOPICAL
  Filled 2019-10-02: qty 30

## 2019-10-02 MED ORDER — ACETAMINOPHEN 650 MG RE SUPP: 650.0000 mg | Freq: Four times a day (QID) | RECTAL | Status: DC | PRN

## 2019-10-02 MED ORDER — VANCOMYCIN HCL 10 G IV SOLR
1500.0000 mg | Freq: Once | INTRAVENOUS | Status: AC
  Administered 2019-10-02: 18:00:00 1500 mg via INTRAVENOUS
  Filled 2019-10-02: qty 1500

## 2019-10-02 MED ORDER — ENOXAPARIN SODIUM 40 MG/0.4ML ~~LOC~~ SOLN
40.0000 mg | Freq: Every day | SUBCUTANEOUS | Status: DC
  Administered 2019-10-02: 40 mg via SUBCUTANEOUS
  Filled 2019-10-02: qty 0.4

## 2019-10-02 MED ORDER — METRONIDAZOLE IN NACL 5-0.79 MG/ML-% IV SOLN
500.0000 mg | Freq: Three times a day (TID) | INTRAVENOUS | Status: DC
  Administered 2019-10-02 – 2019-10-03 (×2): 500 mg via INTRAVENOUS
  Filled 2019-10-02 (×2): qty 100

## 2019-10-02 MED ORDER — ONDANSETRON HCL 4 MG/2ML IJ SOLN: 4.0000 mg | Freq: Four times a day (QID) | INTRAMUSCULAR | Status: DC | PRN

## 2019-10-02 MED ORDER — LEVOFLOXACIN IN D5W 500 MG/100ML IV SOLN
500.0000 mg | Freq: Once | INTRAVENOUS | Status: AC
  Administered 2019-10-02: 17:00:00 500 mg via INTRAVENOUS
  Filled 2019-10-02: qty 100

## 2019-10-02 MED ORDER — DOCUSATE SODIUM 100 MG PO CAPS
100.0000 mg | ORAL_CAPSULE | Freq: Two times a day (BID) | ORAL | Status: DC
  Administered 2019-10-02: 100 mg via ORAL
  Filled 2019-10-02: qty 1

## 2019-10-02 MED ORDER — LEVOFLOXACIN IN D5W 250 MG/50ML IV SOLN
250.0000 mg | Freq: Once | INTRAVENOUS | Status: AC
  Administered 2019-10-02: 20:00:00 250 mg via INTRAVENOUS
  Filled 2019-10-02: qty 50

## 2019-10-02 MED ORDER — ONDANSETRON HCL 4 MG PO TABS: 4.0000 mg | ORAL_TABLET | Freq: Four times a day (QID) | ORAL | Status: DC | PRN

## 2019-10-02 MED ORDER — SODIUM CHLORIDE (PF) 0.9 % IJ SOLN
INTRAMUSCULAR | Status: AC
  Filled 2019-10-02: qty 50

## 2019-10-02 MED ORDER — ACETAMINOPHEN 325 MG PO TABS: 650.0000 mg | ORAL_TABLET | Freq: Four times a day (QID) | ORAL | Status: DC | PRN

## 2019-10-02 MED ORDER — IPRATROPIUM-ALBUTEROL 0.5-2.5 (3) MG/3ML IN SOLN
3.0000 mL | RESPIRATORY_TRACT | Status: DC | PRN
  Filled 2019-10-02: qty 3

## 2019-10-02 MED ORDER — BISACODYL 10 MG RE SUPP: 10.0000 mg | Freq: Every day | RECTAL | Status: DC | PRN

## 2019-10-02 MED ORDER — DEXTROSE-NACL 5-0.9 % IV SOLN
INTRAVENOUS | Status: DC
  Administered 2019-10-02: via INTRAVENOUS

## 2019-10-02 MED ORDER — ALBUTEROL SULFATE (2.5 MG/3ML) 0.083% IN NEBU: 2.5000 mg | INHALATION_SOLUTION | RESPIRATORY_TRACT | Status: DC | PRN

## 2019-10-02 MED ORDER — SENNOSIDES-DOCUSATE SODIUM 8.6-50 MG PO TABS: 1.0000 | ORAL_TABLET | Freq: Every evening | ORAL | Status: DC | PRN

## 2019-10-02 MED ORDER — FLEET ENEMA 7-19 GM/118ML RE ENEM: 1.0000 | ENEMA | Freq: Once | RECTAL | Status: DC | PRN

## 2019-10-02 NOTE — ED Notes (Signed)
4th floor charge nurse asking to delay transfer of pt to floor until it is clarified if the pt is floor appropriate with the [facility].

## 2019-10-02 NOTE — Progress Notes (Signed)
Pharmacy Antibiotic Note  Gabriela Davis is a 78 y.o. female with hx breast cancer presented to the ED on 09/25/2019 with AMS and SOB. Chest CTA was negative for PE, but showed findings with concern for PNA.  Pharmacy has been consulted to start levaquin for infection.  - scr 1.20 (crcl~50)   Plan: - levaquin 500 mg IV x1 given in the ED. Will give 250mg  IV x1 now to get 750mg  total for today, then 750 mg IV q24h starting on 11/14. - monitor renal function closely  _________________________________  Temp (24hrs), Avg:98.8 F (37.1 C), Min:98.8 F (37.1 C), Max:98.8 F (37.1 C)  Recent Labs  Lab 10/16/2019 1142 10/14/2019 1342  WBC 5.5  --   CREATININE 1.20*  --   LATICACIDVEN 1.4 1.6    CrCl cannot be calculated (Unknown ideal weight.).      Thank you for allowing pharmacy to be a part of this patient's care.  Lynelle Doctor 10/14/2019 6:44 PM

## 2019-10-02 NOTE — ED Triage Notes (Addendum)
Pt arrives [facility] from home with complaints of AMS since 2000 last night per family. Pt has open and exposed carcinomas on bilateral breast that are seeping and oozing. Pt was noted to be covered in dry feces.Pt reports she lives at home with family. Pts sat 50% on RA. Pt placed on NRB

## 2019-10-02 NOTE — ED Provider Notes (Signed)
[facility] DEPT Provider Note   CSN: [id number] Arrival date & time: 09/25/2019  1057     History   Chief Complaint Chief Complaint  Patient presents with   Altered Mental Status    HPI Gabriela Davis is a 78 y.o. female.     HPI Level 5 caveat due to altered mental status. Patient brought in for altered mental status.  Reportedly confused since last night.  Has known breast cancer.  Reportedly was covered dry feces.  Initial saturations upon arrival of 50% on room air.  Patient denies lung history but does appear to have COPD on the problem list.  Reportedly has some very poor performance status at baseline. Past Medical History:  Diagnosis Date   Arthritis    Cardiomyopathy ([place])    COPD (chronic obstructive pulmonary disease) ([place])    History of CVA (cerebrovascular accident)    No residual   Hypertension    Stroke ([place])    slight stroke in 2010 - no deficits noted   Tobacco use disorder     Patient Active Problem List   Diagnosis Date Noted   Malignant neoplasm of lower-inner quadrant of right breast of female, estrogen receptor positive ([place]) 08/11/2019   Osteopenia determined by x-ray 09/27/2015   Osteopenia 09/27/2015   Chondrocalcinosis 02/11/2014   Cardiomyopathy, nonischemic ([place]) 12/23/2012   HLD (hyperlipidemia) 12/08/2012   HTN (hypertension) 12/08/2012   Tobacco abuse 12/08/2012   History of CVA (cerebrovascular accident) 12/08/2012   Osteoarthritis 09/11/2011    Past Surgical History:  Procedure Laterality Date   ABDOMINAL HYSTERECTOMY     BREAST BIOPSY Right 2016   benign   CHOLECYSTECTOMY     EXCISION OF BACK LESION N/A 02/20/2016   Procedure: WIDE LOCAL EXCISION BACK LESION X 2 ;  Surgeon: Ralene Ok, MD;  Location: [facility] ORS;  Service: General;  Laterality: N/A;   KNEE SURGERY     x 2-3, L knee   LAMINECTOMY     L4-5   TONSILLECTOMY       OB History   No obstetric history on  file.      Home Medications    Prior to Admission medications   Medication Sig Start Date End Date Taking? Authorizing Provider  anastrozole (ARIMIDEX) 1 MG tablet Take 1 tablet (1 mg total) by mouth daily. 09/23/19  Yes Nicholas Lose, MD    Family History Family History  Problem Relation Age of Onset   CAD Father 49    Social History Social History   Tobacco Use   Smoking status: Current Every Day Smoker    Packs/day: 0.50    Years: 43.00    Pack years: 21.50    Types: Cigarettes   Smokeless tobacco: Never Used  Substance Use Topics   Alcohol use: Yes    Alcohol/week: 0.0 standard drinks    Comment: rare   Drug use: No     Allergies   Cephalexin, Morphine and related, Other, and Penicillins   Review of Systems Review of Systems  Unable to perform ROS: Mental status change     Physical Exam Updated Vital Signs BP 112/77    Pulse (!) 58    Temp 98.8 F (37.1 C) (Rectal)    Resp 19    SpO2 96%   Physical Exam Vitals signs and nursing note reviewed.  HENT:     Head: Atraumatic.  Eyes:     Pupils: Pupils are equal, round, and reactive to light.  Cardiovascular:  Rate and Rhythm: Regular rhythm.  Pulmonary:     Comments: Diffuse harsh breath sounds.  Tachypnea. Abdominal:     Tenderness: There is no abdominal tenderness.  Skin:    General: Skin is warm.     Comments: Skin changes most of inferior aspect of breast and chest wall under the breast.  Somewhat moist.  Somewhat indurated.  Neurological:     Comments: Patient will answer some questions but appears to be somewhat confused.      ED Treatments / Results  Labs (all labs ordered are listed, but only abnormal results are displayed) Labs Reviewed  COMPREHENSIVE METABOLIC PANEL - Abnormal; Notable for the following components:      Result Value   Potassium 5.2 (*)    Glucose, Bld 106 (*)    BUN 43 (*)    Creatinine, Ser 1.20 (*)    Calcium 7.8 (*)    Albumin 2.9 (*)    Total  Bilirubin 1.8 (*)    GFR calc non Af Amer 44 (*)    GFR calc Af Amer 50 (*)    All other components within normal limits  CBC WITH DIFFERENTIAL/PLATELET - Abnormal; Notable for the following components:   RBC 3.58 (*)    MCV 118.2 (*)    MCH 34.4 (*)    MCHC 29.1 (*)    RDW 17.9 (*)    nRBC 0.4 (*)    Lymphs Abs 0.6 (*)    All other components within normal limits  URINALYSIS, ROUTINE W REFLEX MICROSCOPIC - Abnormal; Notable for the following components:   Color, Urine AMBER (*)    APPearance HAZY (*)    Bilirubin Urine SMALL (*)    Protein, ur 30 (*)    Nitrite POSITIVE (*)    Leukocytes,Ua TRACE (*)    Bacteria, UA RARE (*)    All other components within normal limits  CULTURE, BLOOD (ROUTINE X 2)  CULTURE, BLOOD (ROUTINE X 2)  SARS CORONAVIRUS 2 (TAT 6-24 HRS)  LACTIC ACID, PLASMA  LACTIC ACID, PLASMA  PROTIME-INR    EKG EKG Interpretation  Date/Time:  Friday October 02 2019 11:35:56 EST Ventricular Rate:  90 PR Interval:    QRS Duration: 93 QT Interval:  371 QTC Calculation: 454 R Axis:   42 Text Interpretation: sinus with frequent pacs Low voltage, extremity and precordial leads Confirmed by Davonna Belling [phone]) on 10/14/2019 1:00:23 PM   Radiology Ct Angio Chest Pe W And/or Wo Contrast  Result Date: 09/30/2019 CLINICAL DATA:  PE suspected. Shortness of breath. EXAM: CT ANGIOGRAPHY CHEST WITH CONTRAST TECHNIQUE: Multidetector CT imaging of the chest was performed using the standard protocol during bolus administration of intravenous contrast. Multiplanar CT image reconstructions and MIPs were obtained to evaluate the vascular anatomy. CONTRAST:  79mL OMNIPAQUE IOHEXOL 350 MG/ML SOLN COMPARISON:  None. FINDINGS: Cardiovascular: Evaluation for pulmonary emboli is limited by respiratory motion artifact. Given this limitation, no acute PE was identified.The main pulmonary artery is not significantly dilated. The heart size is normal. Aortic calcifications are  noted. Coronary artery calcifications are noted. Mediastinum/Nodes: --there prominent but subcentimeter mediastinal and hilar lymph nodes. --there are enlarged bilateral axillary lymph nodes. For example there is a 2.1 by 1.3 cm left axillary lymph node. --No supraclavicular lymphadenopathy. --Normal thyroid gland. --the esophagus is fluid-filled to the level of the upper thorax. Lungs/Pleura: Evaluation of the lung fields is limited by respiratory motion artifact. There are ground-glass airspace opacities throughout the right upper lobe and right lower  lobe. There are more focal areas of consolidation within the left upper lobe and left lower lobe. There is a rounded pulmonary opacity measuring approximately 1.2 cm in the left lower lobe (axial series 6, image 57). There is a somewhat loculated left-sided pleural effusion. There is significant left-sided volume loss resulting in shift of the mediastinum to the left. Debris is noted within the left mainstem bronchus. There is a trace right-sided pleural effusion. There is some smooth interlobular septal thickening in the right upper lobe. Upper Abdomen: No acute abnormality. Musculoskeletal: There is a right breast mass measuring approximately 3.2 by 4.1 cm. The partially visualized left breast demonstrates edema and skin thickening. IMPRESSION: 1. Evaluation for pulmonary emboli is limited by respiratory motion artifact. Given this limitation, no acute PE was identified. 2. Right breast mass with bilateral axillary lymphadenopathy. Edema and skin thickening of the left breast of unknown clinical significance. Clinical correlation is recommended. 3. Bilateral ground-glass airspace opacities throughout the right upper lobe and right lower lobe, with more focal areas of consolidation within the left upper lobe and left lower lobe. These findings are nonspecific and could represent multifocal pneumonia or aspiration. Underlying metastatic disease is difficult to  exclude. 4. Rounded 1.2 cm nodule in the left lower lobe is concerning for metastatic disease. 5. Small to moderate-sized loculated left-sided pleural effusion. 6. Debris is noted within the left mainstem bronchus. 7. Trace right-sided pleural effusion. Interlobular septal thickening most notably in the right upper lobe may be secondary to interstitial edema. Lymphangitic carcinomatosis is not excluded. 8. Coronary artery and aortic atherosclerosis. Aortic Atherosclerosis (ICD10-I70.0). Electronically Signed   By: Constance Holster M.D.   On: 09/23/2019 16:17   Dg Chest Portable 1 View  Result Date: 09/20/2019 CLINICAL DATA:  Hypoxia. Confusion and altered mental status. Patient had difficulty cooperating with the exam. EXAM: PORTABLE CHEST 1 VIEW COMPARISON:  Radiograph 12/08/2012 FINDINGS: Patient had difficulty with positioning, significant patient rotation. Cardiomegaly. Aortic atherosclerosis. Mediastinal contours are difficult to accurately define given patient rotation. Retrocardiac opacity likely combination of airspace disease and pleural fluid. Mild vascular congestion. Rounded calcifications projecting over the mediastinum are likely calcified lymph nodes. No pneumothorax. IMPRESSION: 1. Retrocardiac opacity, likely combination of pleural effusion and atelectasis/pneumonia. 2. Cardiomegaly with vascular congestion. 3.  Aortic Atherosclerosis (ICD10-I70.0). 4. Rotated exam limits assessment. Electronically Signed   By: Keith Rake M.D.   On: 09/29/2019 13:59    Procedures Procedures (including critical care time)  Medications Ordered in ED Medications  sodium chloride (PF) 0.9 % injection (has no administration in time range)  levofloxacin (LEVAQUIN) IVPB 500 mg (has no administration in time range)  iohexol (OMNIPAQUE) 350 MG/ML injection 100 mL (80 mLs Intravenous Contrast Given 10/11/2019 1553)     Initial Impression / Assessment and Plan / ED Course  I have reviewed the triage  vital signs and the nursing notes.  Pertinent labs & imaging results that were available during my care of the patient were reviewed by me and considered in my medical decision making (see chart for details).        Patient came in with altered mental status.  History of breast cancer.  However has sats of 50% on room air.  X-ray showed possible pneumonia.  CT scan done due to malignancy.  Showed multifocal pneumonia bilaterally and volume loss on the left side.  Will require antibiotics.  Covid test pending.  Overall I think she has a poor prognosis.  Patient has been able to decrease to  nasal cannula. Also potentially a cellulitis on her left breast.  Will admit to hospitalist.  CRITICAL CARE Performed by: Davonna Belling Total critical care time: 30 minutes Critical care time was exclusive of separately billable procedures and treating other patients. Critical care was necessary to treat or prevent imminent or life-threatening deterioration. Critical care was time spent personally by me on the following activities: development of treatment plan with patient and/or surrogate as well as nursing, discussions with consultants, evaluation of patient's response to treatment, examination of patient, obtaining history from patient or surrogate, ordering and performing treatments and interventions, ordering and review of laboratory studies, ordering and review of radiographic studies, pulse oximetry and re-evaluation of patient's condition.   Final Clinical Impressions(s) / ED Diagnoses   Final diagnoses:  Altered mental status, unspecified altered mental status type  Multifocal pneumonia  Hypoxia  Malignant neoplasm of female breast, unspecified estrogen receptor status, unspecified laterality, unspecified site of breast [facility])    ED Discharge Orders    None       Davonna Belling, MD 10/15/2019 1637

## 2019-10-02 NOTE — ED Notes (Addendum)
Gabriela Davis (Son) requesting update on COVID test [phone]. Pts family updated on plan of care with pt permission

## 2019-10-02 NOTE — Progress Notes (Signed)
A consult was received from an ED physician for vancomycin per pharmacy dosing.  The patient's profile has been reviewed for ht/wt/allergies/indication/available labs.    - height 64 inches - weight 81 kg (documented on 08/11/19)  A one time order has been placed for vancomycin 1500 mg IV x1.  Further antibiotics/pharmacy consults should be ordered by admitting physician if indicated.                       Thank you, Lynelle Doctor 09/29/2019  4:51 PM

## 2019-10-02 NOTE — H&P (Signed)
History and Physical    Gabriela Davis [id number] DOB: [birth year] DOA: 10/03/2019  PCP: Denita Lung, MD Patient coming from: Home.  Lives alone.  Son visits daily.  Declined recently since diagnosis of breast cancer.  She is now totally dependent for ADLs.  Chief Complaint: "Altered mental status"  HPI: Gabriela Davis is a 78 y.o. female with history of possible dementia, recent diagnosis of right breast cancer, COPD not on treatment oxygen, NICM not on medication, CVA without deficits, HTN and tobacco use.  Patient is somnolent and could not provide history.  She could only mumbles and follows some command.  Per EDP, confused since last night.  Found covered in dried feces.  Was saturating to 50% on room air when EMS arrived.   Per patient's son, [name] patient lives alone.  She has been in decline since diagnosed with breast cancer about 6 weeks ago.  She was advised to see cardiology for cardiac clearance before mastectomy but has not been able to see a cardiologist.  She was on hormonal therapy for the breast cancer.  She previously is planning to the hospital when his son called EMS.  His son denies fever sick contacts, nausea and vomiting.  He reports chronic cough from smoking cigarettes.  She smokes about 1.5 pack a day.  Occasional beer but not recently.  Denies drug.  Per [name], patient wishes to be full code.  In ED, hemodynamically stable.  Descalated from NRB to 4 L by nasal cannula and maintain saturation in upper 90s.  MCV 118.  K5.2.  Creatinine 1.20.  BUN 43.  Albumin 2.9.  Total bili 1.8.  Otherwise, CBC and CMP not impressive.  Lactic acid 1.4> 1.6.  PT/INR within normal range.  UA concerning for UTI.  EKG sinus rhythm with frequent PACs but no acute ischemic finding.  CXR concerning for multifocal pneumonia.  CTA chest negative for central PE but revealed right breast mass with bilateral axillary LAD, edema and skin thickening of left breast, bilateral GG airspace  opacities, 1.2 cm LLL nodule, moderate loculated left-sided pleural effusion, debris's in left main bronchus and interlobular septal thickening.  Cultures obtained.  Patient was started on Levaquin and vancomycin due to cephalosporin allergies.  Hospitalist service called for encephalopathy and multifocal pneumonia.  ROS Limited review of system due to patient's somnolence PMH Past Medical History:  Diagnosis Date   Arthritis    Cardiomyopathy ([place])    COPD (chronic obstructive pulmonary disease) ([place])    History of CVA (cerebrovascular accident)    No residual   Hypertension    Stroke ([place])    slight stroke in 2010 - no deficits noted   Tobacco use disorder    PSH Past Surgical History:  Procedure Laterality Date   ABDOMINAL HYSTERECTOMY     BREAST BIOPSY Right 2016   benign   CHOLECYSTECTOMY     EXCISION OF BACK LESION N/A 02/20/2016   Procedure: WIDE LOCAL EXCISION BACK LESION X 2 ;  Surgeon: Ralene Ok, MD;  Location: [facility];  Service: General;  Laterality: N/A;   KNEE SURGERY     x 2-3, L knee   LAMINECTOMY     L4-5   TONSILLECTOMY     Fam HX Family History  Problem Relation Age of Onset   CAD Father 38    Social Hx  reports that she has been smoking cigarettes. She has a 21.50 pack-year smoking history. She has never used smokeless tobacco. She reports current  alcohol use. She reports that she does not use drugs.  Allergy Allergies  Allergen Reactions   Cephalexin Anaphylaxis   Morphine And Related Nausea And Vomiting   Other Other (See Comments)    Adhesive tape - causes blisters    Penicillins Hives and Swelling    SEVERE Has patient had a PCN reaction causing immediate rash, facial/tongue/throat swelling, SOB or lightheadedness with hypotension: Yes swelling and hives.  Has patient had a PCN reaction that required hospitalization Yes Has patient had a PCN reaction occurring within the last 10 years:No If all of the above answers are  "NO", then may proceed with Cephalosporin use.    Home Meds Prior to Admission medications   Medication Sig Start Date End Date Taking? Authorizing Provider  anastrozole (ARIMIDEX) 1 MG tablet Take 1 tablet (1 mg total) by mouth daily. 09/23/19  Yes Nicholas Lose, MD    Physical Exam: Vitals:   10/03/2019 1601 10/16/2019 1615 10/17/2019 1700 10/09/2019 1730  BP: 110/74 112/77 110/78 (!) 94/48  Pulse: 63 (!) 58 (!) 57 70  Resp: 14 19 (!) 21 18  Temp:      TempSrc:      SpO2:  96% 98% 99%    GENERAL: No acute distress.  Lying comfortably.  Rises to voice and mumbles HEENT: MMM.  Vision and hearing grossly intact.  NECK: Supple.  No apparent JVD.  RESP:  No IWOB.  Fair air movement bilaterally.  Rhonchi bilaterally. CVS:  RRR. Heart sounds normal.  ABD/GI/GU: Bowel sounds present. Soft. Non tender.  MSK/EXT:  Moves extremities. No apparent deformity or edema.  SKIN: Erythematous skin lesion between and under her breasts.  NEURO: Somnolent but rises to voice and mumbles.  Follows some command.  PERRL.  No facial asymmetry.  Grip strength within normal bilaterally.  3+/5 in both lower extremities.  Patellar reflex symmetric. PSYCH: Calm. Normal affect.   Personally Reviewed Radiological Exams Ct Angio Chest Pe W And/or Wo Contrast  Result Date: 10/05/2019 CLINICAL DATA:  PE suspected. Shortness of breath. EXAM: CT ANGIOGRAPHY CHEST WITH CONTRAST TECHNIQUE: Multidetector CT imaging of the chest was performed using the standard protocol during bolus administration of intravenous contrast. Multiplanar CT image reconstructions and MIPs were obtained to evaluate the vascular anatomy. CONTRAST:  11mL OMNIPAQUE IOHEXOL 350 MG/ML SOLN COMPARISON:  None. FINDINGS: Cardiovascular: Evaluation for pulmonary emboli is limited by respiratory motion artifact. Given this limitation, no acute PE was identified.The main pulmonary artery is not significantly dilated. The heart size is normal. Aortic calcifications  are noted. Coronary artery calcifications are noted. Mediastinum/Nodes: --there prominent but subcentimeter mediastinal and hilar lymph nodes. --there are enlarged bilateral axillary lymph nodes. For example there is a 2.1 by 1.3 cm left axillary lymph node. --No supraclavicular lymphadenopathy. --Normal thyroid gland. --the esophagus is fluid-filled to the level of the upper thorax. Lungs/Pleura: Evaluation of the lung fields is limited by respiratory motion artifact. There are ground-glass airspace opacities throughout the right upper lobe and right lower lobe. There are more focal areas of consolidation within the left upper lobe and left lower lobe. There is a rounded pulmonary opacity measuring approximately 1.2 cm in the left lower lobe (axial series 6, image 57). There is a somewhat loculated left-sided pleural effusion. There is significant left-sided volume loss resulting in shift of the mediastinum to the left. Debris is noted within the left mainstem bronchus. There is a trace right-sided pleural effusion. There is some smooth interlobular septal thickening in the right upper  lobe. Upper Abdomen: No acute abnormality. Musculoskeletal: There is a right breast mass measuring approximately 3.2 by 4.1 cm. The partially visualized left breast demonstrates edema and skin thickening. IMPRESSION: 1. Evaluation for pulmonary emboli is limited by respiratory motion artifact. Given this limitation, no acute PE was identified. 2. Right breast mass with bilateral axillary lymphadenopathy. Edema and skin thickening of the left breast of unknown clinical significance. Clinical correlation is recommended. 3. Bilateral ground-glass airspace opacities throughout the right upper lobe and right lower lobe, with more focal areas of consolidation within the left upper lobe and left lower lobe. These findings are nonspecific and could represent multifocal pneumonia or aspiration. Underlying metastatic disease is difficult to  exclude. 4. Rounded 1.2 cm nodule in the left lower lobe is concerning for metastatic disease. 5. Small to moderate-sized loculated left-sided pleural effusion. 6. Debris is noted within the left mainstem bronchus. 7. Trace right-sided pleural effusion. Interlobular septal thickening most notably in the right upper lobe may be secondary to interstitial edema. Lymphangitic carcinomatosis is not excluded. 8. Coronary artery and aortic atherosclerosis. Aortic Atherosclerosis (ICD10-I70.0). Electronically Signed   By: Constance Holster M.D.   On: 10/13/2019 16:17   Dg Chest Portable 1 View  Result Date: 09/22/2019 CLINICAL DATA:  Hypoxia. Confusion and altered mental status. Patient had difficulty cooperating with the exam. EXAM: PORTABLE CHEST 1 VIEW COMPARISON:  Radiograph 12/08/2012 FINDINGS: Patient had difficulty with positioning, significant patient rotation. Cardiomegaly. Aortic atherosclerosis. Mediastinal contours are difficult to accurately define given patient rotation. Retrocardiac opacity likely combination of airspace disease and pleural fluid. Mild vascular congestion. Rounded calcifications projecting over the mediastinum are likely calcified lymph nodes. No pneumothorax. IMPRESSION: 1. Retrocardiac opacity, likely combination of pleural effusion and atelectasis/pneumonia. 2. Cardiomegaly with vascular congestion. 3.  Aortic Atherosclerosis (ICD10-I70.0). 4. Rotated exam limits assessment. Electronically Signed   By: Keith Rake M.D.   On: 09/23/2019 13:59     Personally Reviewed Labs: CBC: Recent Labs  Lab 10/01/2019 1142  WBC 5.5  NEUTROABS 4.5  HGB 12.3  HCT 42.3  MCV 118.2*  PLT [id number]   Basic Metabolic Panel: Recent Labs  Lab 09/25/2019 1142  NA 136  K 5.2*  CL 99  CO2 29  GLUCOSE 106*  BUN 43*  CREATININE 1.20*  CALCIUM 7.8*   GFR: CrCl cannot be calculated (Unknown ideal weight.). Liver Function Tests: Recent Labs  Lab 10/03/2019 1142  AST 15  ALT 11  ALKPHOS  96  BILITOT 1.8*  PROT 6.5  ALBUMIN 2.9*   No results for input(s): LIPASE, AMYLASE in the last 168 hours. No results for input(s): AMMONIA in the last 168 hours. Coagulation Profile: Recent Labs  Lab 09/26/2019 1245  INR 1.1   Cardiac Enzymes: No results for input(s): CKTOTAL, CKMB, CKMBINDEX, TROPONINI in the last 168 hours. BNP (last 3 results) No results for input(s): PROBNP in the last 8760 hours. HbA1C: No results for input(s): HGBA1C in the last 72 hours. CBG: No results for input(s): GLUCAP in the last 168 hours. Lipid Profile: No results for input(s): CHOL, HDL, LDLCALC, TRIG, CHOLHDL, LDLDIRECT in the last 72 hours. Thyroid Function Tests: No results for input(s): TSH, T4TOTAL, FREET4, T3FREE, THYROIDAB in the last 72 hours. Anemia Panel: No results for input(s): VITAMINB12, FOLATE, FERRITIN, TIBC, IRON, RETICCTPCT in the last 72 hours. Urine analysis:    Component Value Date/Time   COLORURINE AMBER (A) 10/14/2019 1309   APPEARANCEUR HAZY (A) 09/24/2019 1309   LABSPEC 1.018 09/21/2019 1309   PHURINE  5.0 09/22/2019 1309   GLUCOSEU NEGATIVE 09/25/2019 1309   HGBUR NEGATIVE 09/24/2019 1309   BILIRUBINUR SMALL (A) 10/10/2019 1309   KETONESUR NEGATIVE 09/25/2019 1309   PROTEINUR 30 (A) 10/14/2019 1309   NITRITE POSITIVE (A) 10/16/2019 1309   LEUKOCYTESUR TRACE (A) 09/26/2019 1309    Sepsis Labs:  Lactic acid within normal range.  Personally Reviewed EKG:  Sinus rhythm with frequent PACs and low voltage QRS.  Assessment/Plan Acute respiratory failure with hypoxia due to multifocal pneumonia: Hypoxic to 50% EMS.  CXR and CT chest revealed multifocal pneumonia.  Also concern about aspiration given some debris's and left main bronchus.  Allergic to cephalosporins -Vancomycin and Levaquin in ED -Proceed with Levaquin per pneumonia order set -Add IV Flagyl for anaerobic coverage -Supplemental oxygen as needed -Follow cultures and COVID-19 test results.  Acute  metabolic encephalopathy with dementia: Somnolent.  Briefly arouses to voice and mumbles some orders.  Follows some command.  No focal neuro deficit but limited exam due to mental status. -We will obtain CT head without contrast -May need CT head with contrast given history of malignancy -Avoid sedating medications.  Advanced right breast cancer: recently diagnosed with breast cancer started on hormonal therapy by Dr. Lindi Adie.  CTA chest revealed  right breast mass with bilateral axillary LAD, and LLL nodule -Will add Dr. Warrick Parisian to treatment team  Lung nodule: 1.2 cm LLL nodule noted on CTA chest -Per oncology  AKI/azotemia/mild hyperkalemia: Baseline creatinine 0.6-0.7 -Cr 1.2 (admit)>> -D5-NS at 75 cc an hour -Recheck in the morning  Moderate  loculated left-sided pleural effusion: could be from pneumonia or malignancy. -Consider diagnostic thoracocentesis  Chronic COPD: Not on oxygen treatment at home -As needed and scheduled nebulizers after COVID-19 resolved -Supplemental oxygen  History of CVA without residual deficit: No focal neuro deficits but limited exam due to somnolence.  Not on medication -Continue monitoring  NICM not on medication: No signs of fluid overload -Gentle IV fluid -Monitor fluid status  Pyuria: could have UTI -Antibiotics as above -Follow cultures  Tobacco use disorder: reportedly smokes about 1.5 pack a day -Nicotine patch  Mild hyperkalemia -Anticipate improvement with IV fluid -Recheck in the morning  Skin rash: differentials include dermatitis, intertrigo or cutaneous manifestation of breast cancer -Mycolog twice daily  Macrocytosis without anemia-not a heavy drinker percent -Anemia panel  Essential hypertension: Normotensive -Continue monitoring  Deconditioning/debility/goal of care: Recently independent for all ADLs since diagnosis of breast cancer -PT/OT eval -Palliative care consult  DVT prophylaxis: Subcu Lovenox  Code  Status: Full code per patient's son. Family Communication: Updated patient's son over the phone  Disposition Plan: Admit to telemetry-inpatient status Consults called: Added oncology to treatment team.  Ordered palliative care consult Admission status: Inpatient   Mercy Riding MD [facility]  If 7PM-7AM, please contact night-coverage [email or web address] Password [facility]  10/10/2019, 6:13 PM

## 2019-10-02 NOTE — ED Notes (Signed)
Called 4th floor again for update on pts placement. 4th floor charge nurse awaiting answer from [facility] still.

## 2019-10-02 NOTE — ED Notes (Signed)
NRB removed. Pt placed on 4L via [place]. O2 sat 95%

## 2019-10-03 ENCOUNTER — Inpatient Hospital Stay (HOSPITAL_COMMUNITY): Payer: Medicare HMO

## 2019-10-03 DIAGNOSIS — R609 Edema, unspecified: Secondary | ICD-10-CM

## 2019-10-03 DIAGNOSIS — Z853 Personal history of malignant neoplasm of breast: Secondary | ICD-10-CM

## 2019-10-03 DIAGNOSIS — G8194 Hemiplegia, unspecified affecting left nondominant side: Secondary | ICD-10-CM

## 2019-10-03 LAB — HIV ANTIBODY (ROUTINE TESTING W REFLEX): HIV Screen 4th Generation wRfx: NONREACTIVE

## 2019-10-03 LAB — BASIC METABOLIC PANEL
Anion gap: 7 (ref 5–15)
BUN: 44 mg/dL — ABNORMAL HIGH (ref 8–23)
CO2: 32 mmol/L (ref 22–32)
Calcium: 7.9 mg/dL — ABNORMAL LOW (ref 8.9–10.3)
Chloride: 101 mmol/L (ref 98–111)
Creatinine, Ser: 1.11 mg/dL — ABNORMAL HIGH (ref 0.44–1.00)
GFR calc Af Amer: 55 mL/min — ABNORMAL LOW (ref >60)
GFR calc non Af Amer: 48 mL/min — ABNORMAL LOW (ref >60)
Glucose, Bld: 113 mg/dL — ABNORMAL HIGH (ref 70–99)
Potassium: 5.4 mmol/L — ABNORMAL HIGH (ref 3.5–5.1)
Sodium: 140 mmol/L (ref 135–145)

## 2019-10-03 LAB — FERRITIN: Ferritin: 32 ng/mL (ref 11–307)

## 2019-10-03 LAB — VITAMIN B12: Vitamin B-12: 301 pg/mL (ref 180–914)

## 2019-10-03 LAB — CBC
HCT: 40.1 % (ref 36.0–46.0)
Hemoglobin: 11.7 g/dL — ABNORMAL LOW (ref 12.0–15.0)
MCH: 35 pg — ABNORMAL HIGH (ref 26.0–34.0)
MCHC: 29.2 g/dL — ABNORMAL LOW (ref 30.0–36.0)
MCV: 120.1 fL — ABNORMAL HIGH (ref 80.0–100.0)
Platelets: 160 10*3/uL (ref 150–400)
RBC: 3.34 MIL/uL — ABNORMAL LOW (ref 3.87–5.11)
RDW: 17.6 % — ABNORMAL HIGH (ref 11.5–15.5)
WBC: 5.6 10*3/uL (ref 4.0–10.5)
nRBC: 0.4 % — ABNORMAL HIGH (ref 0.0–0.2)

## 2019-10-03 LAB — GLUCOSE, CAPILLARY
Glucose-Capillary: 91 mg/dL (ref 70–99)
Glucose-Capillary: 86 mg/dL (ref 70–99)
Glucose-Capillary: 77 mg/dL (ref 70–99)

## 2019-10-03 LAB — RETICULOCYTES
Immature Retic Fract: 24.4 % — ABNORMAL HIGH (ref 2.3–15.9)
RBC.: 3.39 MIL/uL — ABNORMAL LOW (ref 3.87–5.11)
Retic Count, Absolute: 212.9 10*3/uL — ABNORMAL HIGH (ref 19.0–186.0)
Retic Ct Pct: 6.3 % — ABNORMAL HIGH (ref 0.4–3.1)

## 2019-10-03 LAB — IRON AND TIBC
Iron: 60 ug/dL (ref 28–170)
Saturation Ratios: 29 % (ref 10.4–31.8)
TIBC: 208 ug/dL — ABNORMAL LOW (ref 250–450)
UIBC: 148 ug/dL

## 2019-10-03 LAB — FOLATE: Folate: 12.5 ng/mL (ref >5.9)

## 2019-10-03 LAB — HEMOGLOBIN A1C
Hgb A1c MFr Bld: 4.4 % — ABNORMAL LOW (ref 4.8–5.6)
Mean Plasma Glucose: 79.58 mg/dL

## 2019-10-03 LAB — AMMONIA: Ammonia: 32 umol/L (ref 9–35)

## 2019-10-03 LAB — TSH: TSH: 3.788 u[IU]/mL (ref 0.350–4.500)

## 2019-10-03 LAB — POTASSIUM: Potassium: 5 mmol/L (ref 3.5–5.1)

## 2019-10-03 MED ORDER — HALOPERIDOL LACTATE 5 MG/ML IJ SOLN: 0.5000 mg | INTRAMUSCULAR | Status: DC | PRN

## 2019-10-03 MED ORDER — HALOPERIDOL 0.5 MG PO TABS
0.5000 mg | ORAL_TABLET | ORAL | Status: DC | PRN
  Filled 2019-10-03: qty 1

## 2019-10-03 MED ORDER — LEVOFLOXACIN IN D5W 750 MG/150ML IV SOLN: 750.0000 mg | INTRAVENOUS | Status: DC

## 2019-10-03 MED ORDER — SODIUM CHLORIDE 0.9 % IV SOLN: INTRAVENOUS | Status: DC | PRN

## 2019-10-03 MED ORDER — MORPHINE SULFATE (PF) 2 MG/ML IV SOLN: 1.0000 mg | INTRAVENOUS | Status: DC | PRN

## 2019-10-03 MED ORDER — GLYCOPYRROLATE 0.2 MG/ML IJ SOLN: 0.2000 mg | INTRAMUSCULAR | Status: DC | PRN

## 2019-10-03 MED ORDER — ACETAMINOPHEN 650 MG RE SUPP: 650.0000 mg | Freq: Four times a day (QID) | RECTAL | Status: DC | PRN

## 2019-10-03 MED ORDER — POLYVINYL ALCOHOL 1.4 % OP SOLN: 1.0000 [drp] | Freq: Four times a day (QID) | OPHTHALMIC | Status: DC | PRN

## 2019-10-03 MED ORDER — BISACODYL 10 MG RE SUPP: 10.0000 mg | Freq: Every day | RECTAL | Status: DC | PRN

## 2019-10-03 MED ORDER — GLYCOPYRROLATE 0.2 MG/ML IJ SOLN
0.2000 mg | INTRAMUSCULAR | Status: DC | PRN
  Administered 2019-10-04: 12:00:00 0.2 mg via INTRAVENOUS
  Filled 2019-10-03: qty 1

## 2019-10-03 MED ORDER — HALOPERIDOL LACTATE 2 MG/ML PO CONC
0.5000 mg | ORAL | Status: DC | PRN
  Filled 2019-10-03: qty 0.3

## 2019-10-03 MED ORDER — ASPIRIN 300 MG RE SUPP
300.0000 mg | Freq: Once | RECTAL | Status: DC
  Filled 2019-10-03: qty 1

## 2019-10-03 MED ORDER — LORAZEPAM 2 MG/ML PO CONC: 1.0000 mg | ORAL | Status: DC | PRN

## 2019-10-03 MED ORDER — ONDANSETRON 4 MG PO TBDP: 4.0000 mg | ORAL_TABLET | Freq: Four times a day (QID) | ORAL | Status: DC | PRN

## 2019-10-03 MED ORDER — LORAZEPAM 2 MG/ML IJ SOLN: 1.0000 mg | INTRAMUSCULAR | Status: DC | PRN

## 2019-10-03 MED ORDER — ONDANSETRON HCL 4 MG/2ML IJ SOLN: 4.0000 mg | Freq: Four times a day (QID) | INTRAMUSCULAR | Status: DC | PRN

## 2019-10-03 MED ORDER — STROKE: EARLY STAGES OF RECOVERY BOOK: Freq: Once | Status: DC

## 2019-10-03 MED ORDER — CHLORHEXIDINE GLUCONATE CLOTH 2 % EX PADS
6.0000 | MEDICATED_PAD | Freq: Every day | CUTANEOUS | Status: DC
  Administered 2019-10-03: 16:00:00 6 via TOPICAL

## 2019-10-03 MED ORDER — ACETAMINOPHEN 325 MG PO TABS: 650.0000 mg | ORAL_TABLET | Freq: Four times a day (QID) | ORAL | Status: DC | PRN

## 2019-10-03 MED ORDER — LORAZEPAM 1 MG PO TABS: 1.0000 mg | ORAL_TABLET | ORAL | Status: DC | PRN

## 2019-10-03 MED ORDER — GLYCOPYRROLATE 1 MG PO TABS
1.0000 mg | ORAL_TABLET | ORAL | Status: DC | PRN
  Filled 2019-10-03: qty 1

## 2019-10-03 MED ORDER — DIPHENHYDRAMINE HCL 50 MG/ML IJ SOLN: 12.5000 mg | INTRAMUSCULAR | Status: DC | PRN

## 2019-10-03 MED ORDER — NICOTINE 21 MG/24HR TD PT24
21.0000 mg | MEDICATED_PATCH | Freq: Every day | TRANSDERMAL | Status: DC
  Administered 2019-10-03: 14:00:00 21 mg via TRANSDERMAL
  Filled 2019-10-03: qty 1

## 2019-10-03 NOTE — Progress Notes (Signed)
Pharmacy Antibiotic Note  Gabriela Davis is a 78 y.o. female with hx breast cancer presented to the ED on 10/14/2019 with AMS and SOB. Chest CTA was negative for PE, but showed findings with concern for PNA.  Pharmacy has been consulted to start levaquin for infection.   Severe PCN and cephalosporin allergies noted  No weight available, most recent weight 81 kg on 08/11/19  IBW 67 kg; CrCl 44 ml/min   Plan:  Reduce Levaquin to 750 mg q48 hr with CrCl < 50 ml/min  Pharmacy will sign off, following peripherally for culture results, renal adjustments, LOT, and changes in clinical status  _________________________________  Temp (24hrs), Avg:98.1 F (36.7 C), Min:97.8 F (36.6 C), Max:98.8 F (37.1 C)  Recent Labs  Lab 10/11/2019 1142 10/14/2019 1342 10/03/19 0026  WBC 5.5  --  5.6  CREATININE 1.20*  --  1.11*  LATICACIDVEN 1.4 1.6  --     CrCl cannot be calculated (Unknown ideal weight.).      Thank you for allowing pharmacy to be a part of this patient's care.  Eulas Schweitzer A 10/03/2019 9:36 AM

## 2019-10-03 NOTE — Progress Notes (Signed)
SLP Cancellation Note  Patient Details Name: Gabriela Davis MRN: [id number] DOB: 1941/05/12   Cancelled treatment:       Reason Eval/Treat Not Completed: Fatigue/lethargy limiting ability to participate; nursing stated pt is more alert in a.m.; unable to arouse for BSE/SLE; ST will continue efforts as able.   Elvina Sidle, M.S., CCC-SLP 10/03/2019, 4:24 PM

## 2019-10-03 NOTE — Progress Notes (Signed)
PMT no charge note  Palliative consult request received, chart reviewed. Patient currently off the floor for MRI brain. No family at bedside.  Agree with DNR Await MRI results Med onc has also been consulted, regarding obtaining current recommendations on recent diagnosis of breast cancer.  Will likely have to consider hospice services in some capacity, in my opinion. Full note and final recommendations to follow, patient will be seen on 05-Oct-2019.   Thank you for the consult.  Loistine Chance MD [place] [facility] [phone]

## 2019-10-03 NOTE — Progress Notes (Signed)
MRI brain revealed right MCA CVA as suspected.  Discussed finding with neurology, Dr. Lorraine Lax who recommended rectal aspirin 300 mg once, carotid ultrasound, echocardiogram and transfer to [facility].   Updated patient's son as well.   Transfer order to [facility], telemetry bed, neuro unit placed.

## 2019-10-03 NOTE — Progress Notes (Signed)
VASCULAR LAB PRELIMINARY  PRELIMINARY  PRELIMINARY  PRELIMINARY  Bilateral lower extremity venous duplex completed.    Preliminary report:  See CV proc for preliminary results.   Vada Swift, RVT 10/03/2019, 10:35 AM

## 2019-10-03 NOTE — Progress Notes (Signed)
I had another discussion with patient's son, Gabriela Davis and daughter-in-law over the phone.  Patient with advanced breast cancer and possible lung cancer, multifocal pneumonia and now with large acute ischemic right MCA CVA with additional scattered bilateral PCA and cerebellar infarct.  She has been in significant decline since diagnosis of breast cancer about 6 months ago.  She has been totally dependent for ADL's prior to arrival.  I frankly discussed with patient's family about patient's poor prognosis. I also mentioned about poor quality of life even if she miraculously recovers from these which is very unlikely.  We went over treatment options including full scope care and comfort measure with emphasis on symptomatic care and minimizing suffering.  Family choose the later effective now. They understand the potential death in hours to days in our effort to alleviate pain, anxiety and air hunger with medications during comfort care.  I discussed with them about potential transfer to residential hospice if Gabriela Davis happens to be with [place] for the next day or more.  Family voiced understanding and appreciated the communication and honesty.  Comfort measures initiated.  Palliative care focused order set utilized.  Ordered CSW consult for residential hospice placement.

## 2019-10-03 NOTE — Progress Notes (Signed)
PROGRESS NOTE  Gabriela Davis VHQ:[id number] DOB: [birth year]   PCP: Ronnald Nian, MD  Patient is from: Home.  Lives alone.  Son visits daily.  Declined recently since diagnosis of breast cancer.  She is now totally dependent for ADLs.  DOA: 10/01/2019 LOS: 1  Brief Narrative / Interim history: 78 y.o. female with history of possible dementia, recent diagnosis of right breast cancer, COPD not on treatment oxygen, NICM not on medication, CVA without deficits, HTN and tobacco use presenting with encephalopathy and hypoxic respiratory failure and admitted for acute metabolic encephalopathy, acute respiratory failure due to multifocal pneumonia and possible UTI.  Found hypoxic to 50% on room air when EMS arrived.  In ED, HDS.  Descalated from NRB to 4 L by Smith and saturating in upper 90s.  MCV 118.  K5.2.  Creatinine 1.20.  BUN 43.  Albumin 2.9.  Total bili 1.8.  Otherwise, CBC and CMP not impressive.  LFT normal.  PT/INR normal.  UA concerning for UTI.  EKG sinus rhythm with frequent PACs but no acute ischemic finding.  CXR concerning for multifocal pneumonia.  CTA chest negative for central PE but revealed right breast mass with bilateral axillary LAD, edema and skin thickening of left breast, bilateral GG airspace opacities, 1.2 cm LLL nodule, moderate loculated left-sided pleural effusion, debris's in left main bronchus and interlobular septal thickening.  Cultures obtained.  Patient was started on Levaquin and vancomycin due to cephalosporin allergies.  Hospitalist service called for encephalopathy and multifocal pneumonia.   Subjective: No major events overnight or this morning.  She is a sleepy but rises to voice.  She is not coherent.  Disoriented.  Follows some commands.  Does not appear to be in distress.  Objective: Vitals:   09/27/2019 2205 10/14/2019 2349 10/03/19 0149 10/03/19 0700  BP:  108/74  128/61  Pulse:  (!) 56  97  Resp:  20  18  Temp:  97.8 F (36.6 C)  97.8 F (36.6 C)    TempSrc:  Oral  Oral  SpO2: 97% 100% 98% 96%    Intake/Output Summary (Last 24 hours) at 10/03/2019 1137 Last data filed at 10/03/2019 0600 Gross per 24 hour  Intake 723.62 ml  Output 140 ml  Net 583.62 ml   There were no vitals filed for this visit.  Examination:  GENERAL: Sleepy but rises to voice.  Not coherent.  Disoriented.  Follows some command. HEENT: MMM.  Vision and hearing grossly intact.  NECK: Supple.  No apparent JVD.  RESP:  No IWOB.  Fair aeration bilaterally.  Rhonchi bilaterally. CVS: Bradycardic to 50s. Heart sounds normal.  ABD/GI/GU: Bowel sounds present. Soft. Non tender.  MSK/EXT:  No apparent deformity or edema.  Extremities cold to touch mainly on the left.  Left hemiparesis SKIN: Extremities cold to touch.  Erythematous skin lesion between and under her breasts NEURO: Sleepy but rises to voice.  Not oriented.  Follows some command.  PERRL.  No facial asymmetry.  Normal grip strength in right but not able to move the left arm.  RLE 3+/5.  LLE 2/5.  Patellar reflex symmetric. PSYCH: Sleepy.  Calm.  Disoriented.  Assessment & Plan: Acute respiratory failure with hypoxia due to multifocal pneumonia: Hypoxic to 50% EMS.  CXR and CT chest revealed multifocal pneumonia.  Also concern about aspiration given some debris's and left main bronchus.  COVID-19 negative. Allergic to cephalosporins -Vancomycin and Levaquin in ED -Continue Levaquin and IV Flagyl 11/13>> -Wean oxygen as able -  Follow cultures  Acute metabolic encephalopathy with dementia:  rises to voice and mumbles some words but not coherent.  Follows some command.  Now with left hemiparesis which is new since admission.  Not able to do CT head as patient received IV contrast with CTA chest. -Stat MRI brain ordered -CT head without contrast today. -Avoid sedating medications. -Frequent neuro check  Left hemiparesis: new this morning.  Not a candidate for TPA given advanced cancer.  History of CVA  without residual deficit -CT head and MRI brain as above. -Frequent neuro check -Further CVA work-up after MRI brain.  Advanced right breast cancer: recently diagnosed with breast cancer started on hormonal therapy by Dr. Pamelia Hoit.  CTA chest revealed  right breast mass with bilateral axillary LAD, and LLL nodule -Added Dr. Pamelia Hoit r to treatment team  Lung nodule: 1.2 cm LLL nodule noted on CTA chest -Per oncology  AKI/azotemia/mild hyperkalemia: Baseline creatinine 0.6-0.7 -Cr 1.2 (admit)>> 1.1 -Change IV fluid to normal saline in the setting of possible CVA -Continue monitoring  Moderate  loculated left-sided pleural effusion: could be from pneumonia or malignancy. -Doubt utility of thoracocentesis given improvement in her breathing and known breast cancer.  Chronic COPD: Not on oxygen treatment at home -As needed and scheduled nebulizers -Supplemental oxygen-wean as able  NICM not on medication: No signs of fluid overload -Gentle IV fluid as above -Monitor fluid status  Pyuria: could have UTI -Antibiotics as above -Follow cultures  Tobacco use disorder: reportedly smokes about 1.5 pack a day -Nicotine patch  Mild hyperkalemia -We will give Lokelma or Kayexalate after swallow eval. -Continue IV fluid as above -Recheck  Skin rash: differentials include dermatitis, intertrigo or cutaneous manifestation of breast cancer -Mycolog twice daily  Anemia of chronic disease-anemia panel consistent with this.  H&H stable.  -Continue monitoring.  Essential hypertension: Normotensive -Continue monitoring  Deconditioning/debility/goal of care: was somewhat independent for all ADLs but declined significantly since the diagnosis of breast cancer.  I have discussed her current condition with patient's son, Harvie Heck in light of her underlying comorbidities including advanced breast cancer with metastasis to axillary lymph nodes and possibly to the lung.  Also potential for CVA  now.  We discussed code status as well.  I believe resuscitation (CPR and intubation) would cause more harm than benefit.  Harvie Heck voices understanding this and requested changing her CODE STATUS to DNR/DNI. -PT/OT eval -Palliative care consulted                 DVT prophylaxis: Subcu Lovenox Code Status: DNR/DNI Family Communication: Discussed with patient's son and daughter-in-law. Disposition Plan: Remains inpatient Consultants: Palliative care, oncology  Procedures:  None  Microbiology summarized: 11/13-COVID-19 screen negative 11/13-blood culture pending 11/13-urine culture pending  Sch Meds:  Scheduled Meds:  budesonide (PULMICORT) nebulizer solution  0.5 mg Nebulization BID   docusate sodium  100 mg Oral BID   enoxaparin (LOVENOX) injection  40 mg Subcutaneous QHS   ipratropium-albuterol  3 mL Nebulization Q6H   nystatin-triamcinolone   Topical BID   Continuous Infusions:  dextrose 5 % and 0.9% NaCl 75 mL/hr at 10/03/19 0600   [START ON 09/21/2019] levofloxacin (LEVAQUIN) IV     metronidazole Stopped (10/03/19 0115)   PRN Meds:.acetaminophen **OR** acetaminophen, albuterol, bisacodyl, ipratropium-albuterol, ondansetron **OR** ondansetron (ZOFRAN) IV, senna-docusate, sodium phosphate  Antimicrobials: Anti-infectives (From admission, onward)   Start     Dose/Rate Route Frequency Ordered Stop   10/18/2019 1800  levofloxacin (LEVAQUIN) IVPB 750 mg  750 mg 100 mL/hr over 90 Minutes Intravenous Every 48 hours 10/03/19 0941     10/03/19 1800  levofloxacin (LEVAQUIN) IVPB 750 mg  Status:  Discontinued     750 mg 100 mL/hr over 90 Minutes Intravenous Every 24 hours 10/29/2019 2301 10/03/19 0941   10/03/19 1700  levofloxacin (LEVAQUIN) IVPB 750 mg  Status:  Discontinued     750 mg 100 mL/hr over 90 Minutes Intravenous Every 24 hours Oct 29, 2019 1851 2019/10/29 2303   10-29-2019 2045  metroNIDAZOLE (FLAGYL) IVPB 500 mg     500 mg 100 mL/hr over 60 Minutes  Intravenous Every 8 hours 2019/10/29 2035     10-29-19 1900  Levofloxacin (LEVAQUIN) IVPB 250 mg     250 mg 50 mL/hr over 60 Minutes Intravenous  Once 10/29/19 1851 29-Oct-2019 2047   10-29-2019 1700  vancomycin (VANCOCIN) 1,500 mg in sodium chloride 0.9 % 500 mL IVPB     1,500 mg 250 mL/hr over 120 Minutes Intravenous  Once 10/29/19 1653 10/29/19 2003   2019-10-29 1645  levofloxacin (LEVAQUIN) IVPB 500 mg     500 mg 100 mL/hr over 60 Minutes Intravenous  Once 2019/10/29 1637 Oct 29, 2019 1758       I have personally reviewed the following labs and images: CBC: Recent Labs  Lab 2019/10/29 1142 10/03/19 0026  WBC 5.5 5.6  NEUTROABS 4.5  --   HGB 12.3 11.7*  HCT 42.3 40.1  MCV 118.2* 120.1*  PLT 174 160   BMP &GFR Recent Labs  Lab 29-Oct-2019 1142 10/03/19 0026  NA 136 140  K 5.2* 5.4*  CL 99 101  CO2 29 32  GLUCOSE 106* 113*  BUN 43* 44*  CREATININE 1.20* 1.11*  CALCIUM 7.8* 7.9*   CrCl cannot be calculated (Unknown ideal weight.). Liver & Pancreas: Recent Labs  Lab 10/29/2019 1142  AST 15  ALT 11  ALKPHOS 96  BILITOT 1.8*  PROT 6.5  ALBUMIN 2.9*   No results for input(s): LIPASE, AMYLASE in the last 168 hours. Recent Labs  Lab 10/03/19 0026  AMMONIA 32   Diabetic: Recent Labs    10/03/19 0026  HGBA1C 4.4*   Recent Labs  Lab 10/03/19 0821  GLUCAP 91   Cardiac Enzymes: No results for input(s): CKTOTAL, CKMB, CKMBINDEX, TROPONINI in the last 168 hours. No results for input(s): PROBNP in the last 8760 hours. Coagulation Profile: Recent Labs  Lab 10/29/2019 1245  INR 1.1   Thyroid Function Tests: Recent Labs    10/03/19 0026  TSH 3.788   Lipid Profile: No results for input(s): CHOL, HDL, LDLCALC, TRIG, CHOLHDL, LDLDIRECT in the last 72 hours. Anemia Panel: Recent Labs    10/03/19 0026  VITAMINB12 301  FOLATE 12.5  FERRITIN 32  TIBC 208*  IRON 60  RETICCTPCT 6.3*   Urine analysis:    Component Value Date/Time   COLORURINE AMBER (A) 10-29-2019  1309   APPEARANCEUR HAZY (A) 10/29/2019 1309   LABSPEC 1.018 29-Oct-2019 1309   PHURINE 5.0 29-Oct-2019 1309   GLUCOSEU NEGATIVE 29-Oct-2019 1309   HGBUR NEGATIVE 2019/10/29 1309   BILIRUBINUR SMALL (A) 2019/10/29 1309   KETONESUR NEGATIVE 10-29-2019 1309   PROTEINUR 30 (A) 10/29/19 1309   NITRITE POSITIVE (A) 10-29-19 1309   LEUKOCYTESUR TRACE (A) 10/29/19 1309   Sepsis Labs: Invalid input(s): PROCALCITONIN, LACTICIDVEN  Microbiology: Recent Results (from the past 240 hour(s))  SARS CORONAVIRUS 2 (TAT 6-24 HRS) Nasopharyngeal Nasopharyngeal Swab     Status: None   Collection Time: 10-29-19  1:53  PM   Specimen: Nasopharyngeal Swab  Result Value Ref Range Status   SARS Coronavirus 2 NEGATIVE NEGATIVE Final    Comment: (NOTE) SARS-CoV-2 target nucleic acids are NOT DETECTED. The SARS-CoV-2 RNA is generally detectable in upper and lower respiratory specimens during the acute phase of infection. Negative results do not preclude SARS-CoV-2 infection, do not rule out co-infections with other pathogens, and should not be used as the sole basis for treatment or other patient management decisions. Negative results must be combined with clinical observations, patient history, and epidemiological information. The expected result is Negative. Fact Sheet for Patients: HairSlick.no Fact Sheet for Healthcare Providers: quierodirigir.com This test is not yet approved or cleared by the [place] FDA and  has been authorized for detection and/or diagnosis of SARS-CoV-2 by FDA under an Emergency Use Authorization (EUA). This EUA will remain  in effect (meaning this test can be used) for the duration of the COVID-19 declaration under Section 56 4(b)(1) of the Act, 21 U.S.C. section 360bbb-3(b)(1), unless the authorization is terminated or revoked sooner. Performed at [facility], [address] [place], [place] [address]       Radiology Studies: Ct Angio Chest Pe W And/or Wo Contrast  Result Date: 09/26/2019 CLINICAL DATA:  PE suspected. Shortness of breath. EXAM: CT ANGIOGRAPHY CHEST WITH CONTRAST TECHNIQUE: Multidetector CT imaging of the chest was performed using the standard protocol during bolus administration of intravenous contrast. Multiplanar CT image reconstructions and MIPs were obtained to evaluate the vascular anatomy. CONTRAST:  80mL OMNIPAQUE IOHEXOL 350 MG/ML SOLN COMPARISON:  None. FINDINGS: Cardiovascular: Evaluation for pulmonary emboli is limited by respiratory motion artifact. Given this limitation, no acute PE was identified.The main pulmonary artery is not significantly dilated. The heart size is normal. Aortic calcifications are noted. Coronary artery calcifications are noted. Mediastinum/Nodes: --there prominent but subcentimeter mediastinal and hilar lymph nodes. --there are enlarged bilateral axillary lymph nodes. For example there is a 2.1 by 1.3 cm left axillary lymph node. --No supraclavicular lymphadenopathy. --Normal thyroid gland. --the esophagus is fluid-filled to the level of the upper thorax. Lungs/Pleura: Evaluation of the lung fields is limited by respiratory motion artifact. There are ground-glass airspace opacities throughout the right upper lobe and right lower lobe. There are more focal areas of consolidation within the left upper lobe and left lower lobe. There is a rounded pulmonary opacity measuring approximately 1.2 cm in the left lower lobe (axial series 6, image 57). There is a somewhat loculated left-sided pleural effusion. There is significant left-sided volume loss resulting in shift of the mediastinum to the left. Debris is noted within the left mainstem bronchus. There is a trace right-sided pleural effusion. There is some smooth interlobular septal thickening in the right upper lobe. Upper Abdomen: No acute abnormality. Musculoskeletal: There is a right breast mass measuring  approximately 3.2 by 4.1 cm. The partially visualized left breast demonstrates edema and skin thickening. IMPRESSION: 1. Evaluation for pulmonary emboli is limited by respiratory motion artifact. Given this limitation, no acute PE was identified. 2. Right breast mass with bilateral axillary lymphadenopathy. Edema and skin thickening of the left breast of unknown clinical significance. Clinical correlation is recommended. 3. Bilateral ground-glass airspace opacities throughout the right upper lobe and right lower lobe, with more focal areas of consolidation within the left upper lobe and left lower lobe. These findings are nonspecific and could represent multifocal pneumonia or aspiration. Underlying metastatic disease is difficult to exclude. 4. Rounded 1.2 cm nodule in the left lower  lobe is concerning for metastatic disease. 5. Small to moderate-sized loculated left-sided pleural effusion. 6. Debris is noted within the left mainstem bronchus. 7. Trace right-sided pleural effusion. Interlobular septal thickening most notably in the right upper lobe may be secondary to interstitial edema. Lymphangitic carcinomatosis is not excluded. 8. Coronary artery and aortic atherosclerosis. Aortic Atherosclerosis (ICD10-I70.0). Electronically Signed   By: Katherine Mantle M.D.   On: November 01, 2019 16:17   Dg Chest Portable 1 View  Result Date: 01-Nov-2019 CLINICAL DATA:  Hypoxia. Confusion and altered mental status. Patient had difficulty cooperating with the exam. EXAM: PORTABLE CHEST 1 VIEW COMPARISON:  Radiograph 12/08/2012 FINDINGS: Patient had difficulty with positioning, significant patient rotation. Cardiomegaly. Aortic atherosclerosis. Mediastinal contours are difficult to accurately define given patient rotation. Retrocardiac opacity likely combination of airspace disease and pleural fluid. Mild vascular congestion. Rounded calcifications projecting over the mediastinum are likely calcified lymph nodes. No  pneumothorax. IMPRESSION: 1. Retrocardiac opacity, likely combination of pleural effusion and atelectasis/pneumonia. 2. Cardiomegaly with vascular congestion. 3.  Aortic Atherosclerosis (ICD10-I70.0). 4. Rotated exam limits assessment. Electronically Signed   By: Narda Rutherford M.D.   On: 11-01-19 13:59   Vas [place] Lower Extremity Venous (dvt)  Result Date: 10/03/2019  Lower Venous Study Indications: Edema.  Risk Factors: Cancer Breast cancer tobacco abuse. Comparison Study: Prior study from 12/08/12 is available for comparison. Performing Technologist: Sherren Kerns RVS  Examination Guidelines: A complete evaluation includes B-mode imaging, spectral Doppler, color Doppler, and power Doppler as needed of all accessible portions of each vessel. Bilateral testing is considered an integral part of a complete examination. Limited examinations for reoccurring indications may be performed as noted.  +---------+---------------+---------+-----------+----------+--------------+  RIGHT     Compressibility Phasicity Spontaneity Properties Thrombus Aging  +---------+---------------+---------+-----------+----------+--------------+  CFV       Full                                             pulsatile       +---------+---------------+---------+-----------+----------+--------------+  SFJ       Full                                                             +---------+---------------+---------+-----------+----------+--------------+  FV Prox   Full                                                             +---------+---------------+---------+-----------+----------+--------------+  FV Mid    Full                                                             +---------+---------------+---------+-----------+----------+--------------+  FV Distal Full                                                             +---------+---------------+---------+-----------+----------+--------------+  PFV       Full                                                              +---------+---------------+---------+-----------+----------+--------------+  POP       Full                                             pulsatile       +---------+---------------+---------+-----------+----------+--------------+  PTV       Full                                                             +---------+---------------+---------+-----------+----------+--------------+  PERO      Full                                                             +---------+---------------+---------+-----------+----------+--------------+   +---------+---------------+---------+-----------+----------+--------------+  LEFT      Compressibility Phasicity Spontaneity Properties Thrombus Aging  +---------+---------------+---------+-----------+----------+--------------+  CFV       Full                                             pulsatile       +---------+---------------+---------+-----------+----------+--------------+  SFJ       Full                                                             +---------+---------------+---------+-----------+----------+--------------+  FV Prox   Full                                                             +---------+---------------+---------+-----------+----------+--------------+  FV Mid    Full                                                             +---------+---------------+---------+-----------+----------+--------------+  FV Distal Full                                                             +---------+---------------+---------+-----------+----------+--------------+  PFV       Full                                                             +---------+---------------+---------+-----------+----------+--------------+  POP       Full                                             pulsatile       +---------+---------------+---------+-----------+----------+--------------+  PTV       Full                                                              +---------+---------------+---------+-----------+----------+--------------+  PERO      Full                                                             +---------+---------------+---------+-----------+----------+--------------+     Summary: Right: There is no evidence of deep vein thrombosis in the lower extremity. Left: There is no evidence of deep vein thrombosis in the lower extremity.  *See table(s) above for measurements and observations. Electronically signed by Sherald Hess MD on 10/03/2019 at 10:40:46 AM.    Final    45 minutes with more than 50% spent in reviewing records, counseling patient/family and coordinating care.  Takuya Lariccia T. Kris No [facility]  If 7PM-7AM, please contact night-coverage [email or web address] Password [facility] 10/03/2019, 11:37 AM

## 2019-10-04 DIAGNOSIS — C773 Secondary and unspecified malignant neoplasm of axilla and upper limb lymph nodes: Secondary | ICD-10-CM

## 2019-10-04 DIAGNOSIS — I63532 Cerebral infarction due to unspecified occlusion or stenosis of left posterior cerebral artery: Secondary | ICD-10-CM

## 2019-10-04 DIAGNOSIS — I63511 Cerebral infarction due to unspecified occlusion or stenosis of right middle cerebral artery: Secondary | ICD-10-CM

## 2019-10-04 DIAGNOSIS — Z7189 Other specified counseling: Secondary | ICD-10-CM

## 2019-10-04 DIAGNOSIS — Z515 Encounter for palliative care: Secondary | ICD-10-CM

## 2019-10-04 DIAGNOSIS — I63531 Cerebral infarction due to unspecified occlusion or stenosis of right posterior cerebral artery: Secondary | ICD-10-CM

## 2019-10-04 DIAGNOSIS — R52 Pain, unspecified: Secondary | ICD-10-CM

## 2019-10-04 DIAGNOSIS — C50912 Malignant neoplasm of unspecified site of left female breast: Secondary | ICD-10-CM

## 2019-10-04 DIAGNOSIS — G9341 Metabolic encephalopathy: Secondary | ICD-10-CM

## 2019-10-04 MED ORDER — POLYVINYL ALCOHOL 1.4 % OP SOLN: 1.0000 [drp] | Freq: Four times a day (QID) | OPHTHALMIC | 0 refills | Status: AC | PRN

## 2019-10-04 MED ORDER — BISACODYL 10 MG RE SUPP: 10.0000 mg | Freq: Every day | RECTAL | 0 refills | Status: AC | PRN

## 2019-10-04 MED ORDER — LORAZEPAM 2 MG/ML IJ SOLN: 1.0000 mg | INTRAMUSCULAR | 0 refills | Status: AC | PRN

## 2019-10-04 MED ORDER — ONDANSETRON 4 MG PO TBDP: 4.0000 mg | ORAL_TABLET | Freq: Four times a day (QID) | ORAL | 0 refills | Status: AC | PRN

## 2019-10-04 MED ORDER — ALBUTEROL SULFATE (2.5 MG/3ML) 0.083% IN NEBU: 2.5000 mg | INHALATION_SOLUTION | RESPIRATORY_TRACT | 12 refills | Status: AC | PRN

## 2019-10-04 MED ORDER — HALOPERIDOL LACTATE 5 MG/ML IJ SOLN: 0.5000 mg | INTRAMUSCULAR | 0 refills | Status: AC | PRN

## 2019-10-04 MED ORDER — MORPHINE SULFATE (PF) 2 MG/ML IV SOLN: 1.0000 mg | INTRAVENOUS | 0 refills | Status: AC | PRN

## 2019-10-04 MED ORDER — GLYCOPYRROLATE 0.2 MG/ML IJ SOLN: 0.2000 mg | INTRAMUSCULAR | 0 refills | Status: AC | PRN

## 2019-10-07 LAB — CULTURE, BLOOD (ROUTINE X 2)
Culture: NO GROWTH
Culture: NO GROWTH
Special Requests: ADEQUATE

## 2019-10-20 NOTE — TOC Progression Note (Signed)
Transition of Care [facility]) - Progression Note    Patient Details  Name: Gabriela Davis MRN: [id number] Date of Birth: 01/28/1941  Transition of Care [facility] [address] CM/SW Contact  Joaquin Courts, RN Phone Number: 10/13/2019, 2:07 PM  Clinical Narrative:    CM was able to get in tough with randy Rieth, patient's son who expresses that he wishes for his mother to go to a residential hospice.  Patient's son and daughter in law live in [place]) and would like for patient to go to a residential facility close to them. CM offered hospice choice of facilities that service around that area and son selected [facility] [place]. CM reached out to facility and placed a referral. No bed availability today but anticipate a bed for tomorrow 11/16. All requested information was faxed to facility.     Expected Discharge Plan: Hospice Medical Facility Barriers to Discharge: Other (comment)(unable to reach family to offer choice)  Expected Discharge Plan and Services Expected Discharge Plan: [place]   Discharge Planning Services: CM Consult Post Acute Care Choice: Hospice Living arrangements for the past 2 months: Single Family Home Expected Discharge Date: 10-13-19               DME Arranged: N/A DME Agency: NA       HH Arranged: NA HH Agency: NA         Social Determinants of Health (SDOH) Interventions    Readmission Risk Interventions No flowsheet data found.

## 2019-10-20 NOTE — Discharge Summary (Signed)
Physician Discharge Summary  Gabriela Davis [id number] DOB: 1941-09-18 DOA: 10/03/2019  PCP: Denita Lung, MD  Admit date: 10/17/2019 Discharge date: October 12, 2019  Admitted From: Home Disposition: Residential hospice  Discharge Condition: Guarded prognosis likely days CODE STATUS: DNR/DNI   Hospital Course: 78 y.o.femalewith history ofpossible dementia, recent diagnosis of right breast cancer, COPD not on treatment oxygen,NICM not on medication, CVA without deficits, HTN and tobacco use presenting with encephalopathy and hypoxic respiratory failure and admitted for acute metabolic encephalopathy, acute respiratory failure due to multifocal pneumonia and possible UTI.  Found hypoxic to 50% on room air when EMS arrived.  In ED, HDS. Descalated from NRB to 4 L by [place] and saturating in upper 90s. MCV 118. K5.2. Creatinine 1.20. BUN 43. Albumin 2.9. Total bili 1.8. Otherwise, CBC and CMP not impressive.  LFT normal.  PT/INR normal. UA concerning for UTI. EKG sinus rhythm with frequent PACs but no acute ischemic finding. CXR concerning for multifocal pneumonia. CTA chest negative for central PE butrevealed right breast mass with bilateral axillary LAD, edema and skin thickening of left breast, bilateral GG airspace opacities, 1.2 cm LLL nodule, moderate loculated left-sided pleural effusion, debris's in left main bronchus and interlobular septal thickening.Cultures obtained. Patient was started on Levaquin and vancomycin due to cephalosporin allergies.Hospitalist service called for encephalopathy and multifocal pneumonia.  The next day, patient continued to be somnolent.  She also had left-sided hemiparesis.  MRI brain obtained and revealed large acute right MCA CVA with additional scattered bilateral PCA and cerebellar infarcts.  Family discussion held and scope of treatment and goal of care discussed with patient's son, [name] and daughter-in-law in light of patient's  advanced breast cancer, significant CVA, possible lung cancer, encephalopathy and multifocal pneumonia.  Family felt full scope treatment at this time would be more harm than benefit to patient, and comfort measures would be appropriate.  Comfort measures initiated.  CSW consulted for residential hospice placement.   See individual problem list below for more on hospital course.  Discharge Diagnoses:  Acute respiratory failure with hypoxia due to multifocal pneumonia:Hypoxic to 50% EMS. CXR and CT chest revealed multifocal pneumonia.Also concern about aspiration given somedebris's and left main bronchus.  COVID-19 negative.Allergic to cephalosporins -Vancomycin and Levaquin in ED -Continue Levaquin and IV Flagyl 11/13>> 11/4 -Comfort measures initiated on 11/4  Left hemiparesis/acute metabolic encephalopathy/right MCA CVA/Scattered bilateral PCA CVA/Cerebellar CVA-noted on MRI -Comfort measures initiated  Advanced right breast cancer Bilateral axillary nodules Left lower lobe lung nodules: -Followed by Dr. Phineas Douglas.  Per his son, she was recommended cardiac clearance for mastectomy but did not pursue -She has been on hormonal therapy -Comfort measures  AKI/azotemia/mild hyperkalemia: Baseline creatinine 0.6-0.7  Moderate loculated left-sided pleural effusion: could be from pneumonia or malignancy.  Chronic COPD:Not on oxygen treatment at home  NICMnot on medication:No signs of fluid overload  Pyuria: could have UTI  Tobacco use disorder:reportedly smokes about 1.5 pack a day  Mild hyperkalemia: Resolved  Skin rash: differentials include dermatitis, intertrigoorcutaneous manifestation of breast cancer -Mycolog twice daily  Anemia of chronic disease-anemia panel consistent with this.  H&H stable.   Essential hypertension: Normotensive  Deconditioning/debility/goal of care:Comfort measures.  Discharge Instructions   Allergies as of 2019/10/12       Reactions   Cephalexin Anaphylaxis   Morphine And Related Nausea And Vomiting   Other Other (See Comments)   Adhesive tape - causes blisters    Penicillins Hives, Swelling   SEVERE Has patient had a PCN  reaction causing immediate rash, facial/tongue/throat swelling, SOB or lightheadedness with hypotension: Yes swelling and hives.  Has patient had a PCN reaction that required hospitalization Yes Has patient had a PCN reaction occurring within the last 10 years:No If all of the above answers are "NO", then may proceed with Cephalosporin use.      Medication List    STOP taking these medications   anastrozole 1 MG tablet Commonly known as: ARIMIDEX     TAKE these medications   albuterol (2.5 MG/3ML) 0.083% nebulizer solution Commonly known as: PROVENTIL Take 3 mLs (2.5 mg total) by nebulization every 2 (two) hours as needed for wheezing.   bisacodyl 10 MG suppository Commonly known as: DULCOLAX Place 1 suppository (10 mg total) rectally daily as needed for moderate constipation.   glycopyrrolate 0.2 MG/ML injection Commonly known as: ROBINUL Inject 1 mL (0.2 mg total) into the vein every 4 (four) hours as needed (excessive secretions).   haloperidol lactate 5 MG/ML injection Commonly known as: HALDOL Inject 0.1 mLs (0.5 mg total) into the vein every 4 (four) hours as needed (or delirium).   LORazepam 2 MG/ML injection Commonly known as: ATIVAN Inject 0.5 mLs (1 mg total) into the vein every 4 (four) hours as needed for anxiety.   morphine 2 MG/ML injection Inject 0.5 mLs (1 mg total) into the vein every 2 (two) hours as needed (or dyspnea).   ondansetron 4 MG disintegrating tablet Commonly known as: ZOFRAN-ODT Take 1 tablet (4 mg total) by mouth every 6 (six) hours as needed for nausea.   polyvinyl alcohol 1.4 % ophthalmic solution Commonly known as: LIQUIFILM TEARS Place 1 drop into both eyes 4 (four) times daily as needed for dry eyes.        Consultations:  Neurology  Palliative care  Procedures/Studies:  2D Echo: None  Ct Head Wo Contrast  Result Date: 10/03/2019 CLINICAL DATA:  Encephalopathy EXAM: CT HEAD WITHOUT CONTRAST TECHNIQUE: Contiguous axial images were obtained from the base of the skull through the vertex without intravenous contrast. COMPARISON:  08/11/2017 FINDINGS: Brain: There is low-density throughout the right frontal lobe, sylvian fissure and temporal lobe compatible with acute infarction. No hemorrhage or hydrocephalus. There is atrophy and chronic small vessel disease changes. Vascular: No hyperdense vessel or unexpected calcification. Skull: No acute calvarial abnormality. Sinuses/Orbits: Visualized paranasal sinuses and mastoids clear. Orbital soft tissues unremarkable. Other: None IMPRESSION: Area of low-density in the right frontal, temporal lobes and sylvian fissure concerning for acute MCA infarct. No hemorrhage. Atrophy, chronic small vessel disease. Electronically Signed   By: Rolm Baptise M.D.   On: 10/03/2019 19:37   Ct Angio Chest Pe W And/or Wo Contrast  Result Date: 10/05/2019 CLINICAL DATA:  PE suspected. Shortness of breath. EXAM: CT ANGIOGRAPHY CHEST WITH CONTRAST TECHNIQUE: Multidetector CT imaging of the chest was performed using the standard protocol during bolus administration of intravenous contrast. Multiplanar CT image reconstructions and MIPs were obtained to evaluate the vascular anatomy. CONTRAST:  52mL OMNIPAQUE IOHEXOL 350 MG/ML SOLN COMPARISON:  None. FINDINGS: Cardiovascular: Evaluation for pulmonary emboli is limited by respiratory motion artifact. Given this limitation, no acute PE was identified.The main pulmonary artery is not significantly dilated. The heart size is normal. Aortic calcifications are noted. Coronary artery calcifications are noted. Mediastinum/Nodes: --there prominent but subcentimeter mediastinal and hilar lymph nodes. --there are enlarged bilateral  axillary lymph nodes. For example there is a 2.1 by 1.3 cm left axillary lymph node. --No supraclavicular lymphadenopathy. --Normal thyroid gland. --the esophagus is  fluid-filled to the level of the upper thorax. Lungs/Pleura: Evaluation of the lung fields is limited by respiratory motion artifact. There are ground-glass airspace opacities throughout the right upper lobe and right lower lobe. There are more focal areas of consolidation within the left upper lobe and left lower lobe. There is a rounded pulmonary opacity measuring approximately 1.2 cm in the left lower lobe (axial series 6, image 57). There is a somewhat loculated left-sided pleural effusion. There is significant left-sided volume loss resulting in shift of the mediastinum to the left. Debris is noted within the left mainstem bronchus. There is a trace right-sided pleural effusion. There is some smooth interlobular septal thickening in the right upper lobe. Upper Abdomen: No acute abnormality. Musculoskeletal: There is a right breast mass measuring approximately 3.2 by 4.1 cm. The partially visualized left breast demonstrates edema and skin thickening. IMPRESSION: 1. Evaluation for pulmonary emboli is limited by respiratory motion artifact. Given this limitation, no acute PE was identified. 2. Right breast mass with bilateral axillary lymphadenopathy. Edema and skin thickening of the left breast of unknown clinical significance. Clinical correlation is recommended. 3. Bilateral ground-glass airspace opacities throughout the right upper lobe and right lower lobe, with more focal areas of consolidation within the left upper lobe and left lower lobe. These findings are nonspecific and could represent multifocal pneumonia or aspiration. Underlying metastatic disease is difficult to exclude. 4. Rounded 1.2 cm nodule in the left lower lobe is concerning for metastatic disease. 5. Small to moderate-sized loculated left-sided pleural effusion. 6. Debris is  noted within the left mainstem bronchus. 7. Trace right-sided pleural effusion. Interlobular septal thickening most notably in the right upper lobe may be secondary to interstitial edema. Lymphangitic carcinomatosis is not excluded. 8. Coronary artery and aortic atherosclerosis. Aortic Atherosclerosis (ICD10-I70.0). Electronically Signed   By: Constance Holster M.D.   On: 10/15/2019 16:17   Mr Brain Wo Contrast  Result Date: 10/03/2019 CLINICAL DATA:  Initial evaluation for acute altered mental status, left-sided weakness. EXAM: MRI HEAD WITHOUT CONTRAST TECHNIQUE: Multiplanar, multiecho pulse sequences of the brain and surrounding structures were obtained without intravenous contrast. COMPARISON:  Prior MRI from 02/27/2018. FINDINGS: Brain: Diffuse prominence of the CSF containing spaces compatible with generalized age-related cerebral atrophy. Patchy and confluent T2/FLAIR hyperintensity within the periventricular in deep white matter both cerebral hemispheres most consistent with chronic small vessel ischemic disease, mild to moderate in nature. Large confluent area of restricted diffusion involving the right frontal lobe, including the right insula, right basal ganglia, and overlying supra ganglionic cortex, compatible with large acute right MCA territory infarct. Additional patchy restricted diffusion involving the right occipital lobe compatible with acute right PCA territory infarct. Patchy small volume foci of restricted diffusion involving the left parieto-occipital gray matter and underlying periventricular white matter consistent with acute left PCA territory infarct. Few small acute to early subacute bilateral cerebellar infarcts noted as well, right greater than left. No associated hemorrhage or significant mass effect. No encephalomalacia to suggest chronic cortical infarction. Few small scattered foci of chronic hemosiderin staining noted within the right frontal and posterior temporal regions.  No mass lesion, midline shift or mass effect. No hydrocephalus. No extra-axial fluid collection. Pituitary gland suprasellar region within normal limits. Midline structures intact. Vascular: Major intracranial vascular flow voids are maintained. Skull and upper cervical spine: Craniocervical junction within normal limits. Visualized upper cervical spine demonstrates no significant finding. Bone marrow signal intensity within normal limits. No scalp soft tissue abnormality. Sinuses/Orbits: Globes and orbital soft  tissues within normal limits. Chronic frontoethmoidal and left maxillary sinusitis noted. Paranasal sinuses are otherwise largely clear. Left greater than right mastoid effusions noted. Small amount of layering fluid noted within the nasopharynx. Other: None. IMPRESSION: 1. Large acute ischemic right MCA territory infarct, with additional scattered bilateral PCA and cerebellar infarcts as above. No associated hemorrhage or significant mass effect. 2. Underlying age-related cerebral atrophy with mild to moderate chronic microvascular ischemic disease. 3. Chronic fronto ethmoidal and left maxillary sinusitis. Electronically Signed   By: Jeannine Boga M.D.   On: 10/03/2019 16:02   Dg Chest Portable 1 View  Result Date: 09/28/2019 CLINICAL DATA:  Hypoxia. Confusion and altered mental status. Patient had difficulty cooperating with the exam. EXAM: PORTABLE CHEST 1 VIEW COMPARISON:  Radiograph 12/08/2012 FINDINGS: Patient had difficulty with positioning, significant patient rotation. Cardiomegaly. Aortic atherosclerosis. Mediastinal contours are difficult to accurately define given patient rotation. Retrocardiac opacity likely combination of airspace disease and pleural fluid. Mild vascular congestion. Rounded calcifications projecting over the mediastinum are likely calcified lymph nodes. No pneumothorax. IMPRESSION: 1. Retrocardiac opacity, likely combination of pleural effusion and  atelectasis/pneumonia. 2. Cardiomegaly with vascular congestion. 3.  Aortic Atherosclerosis (ICD10-I70.0). 4. Rotated exam limits assessment. Electronically Signed   By: Keith Rake M.D.   On: 10/09/2019 13:59   Vas [place] Lower Extremity Venous (dvt)  Result Date: 10/03/2019  Lower Venous Study Indications: Edema.  Risk Factors: Cancer Breast cancer tobacco abuse. Comparison Study: Prior study from 12/08/12 is available for comparison. Performing Technologist: Sharion Dove RVS  Examination Guidelines: A complete evaluation includes B-mode imaging, spectral Doppler, color Doppler, and power Doppler as needed of all accessible portions of each vessel. Bilateral testing is considered an integral part of a complete examination. Limited examinations for reoccurring indications may be performed as noted.  +---------+---------------+---------+-----------+----------+--------------+  RIGHT     Compressibility Phasicity Spontaneity Properties Thrombus Aging  +---------+---------------+---------+-----------+----------+--------------+  CFV       Full                                             pulsatile       +---------+---------------+---------+-----------+----------+--------------+  SFJ       Full                                                             +---------+---------------+---------+-----------+----------+--------------+  FV Prox   Full                                                             +---------+---------------+---------+-----------+----------+--------------+  FV Mid    Full                                                             +---------+---------------+---------+-----------+----------+--------------+  FV Distal Full                                                             +---------+---------------+---------+-----------+----------+--------------+  PFV       Full                                                              +---------+---------------+---------+-----------+----------+--------------+  POP       Full                                             pulsatile       +---------+---------------+---------+-----------+----------+--------------+  PTV       Full                                                             +---------+---------------+---------+-----------+----------+--------------+  PERO      Full                                                             +---------+---------------+---------+-----------+----------+--------------+   +---------+---------------+---------+-----------+----------+--------------+  LEFT      Compressibility Phasicity Spontaneity Properties Thrombus Aging  +---------+---------------+---------+-----------+----------+--------------+  CFV       Full                                             pulsatile       +---------+---------------+---------+-----------+----------+--------------+  SFJ       Full                                                             +---------+---------------+---------+-----------+----------+--------------+  FV Prox   Full                                                             +---------+---------------+---------+-----------+----------+--------------+  FV Mid    Full                                                             +---------+---------------+---------+-----------+----------+--------------+  FV Distal Full                                                             +---------+---------------+---------+-----------+----------+--------------+  PFV       Full                                                             +---------+---------------+---------+-----------+----------+--------------+  POP       Full                                             pulsatile       +---------+---------------+---------+-----------+----------+--------------+  PTV       Full                                                              +---------+---------------+---------+-----------+----------+--------------+  PERO      Full                                                             +---------+---------------+---------+-----------+----------+--------------+     Summary: Right: There is no evidence of deep vein thrombosis in the lower extremity. Left: There is no evidence of deep vein thrombosis in the lower extremity.  *See table(s) above for measurements and observations. Electronically signed by Monica Martinez MD on 10/03/2019 at 10:40:46 AM.    Final       Discharge Exam: Vitals:   10/03/19 2031 10/31/2019 1352  BP:  (!) 100/48  Pulse:  (!) 43  Resp:  12  Temp:  99 F (37.2 C)  SpO2: (!) 85% (!) 86%    GENERAL: Does not appear to be in distress. Somnolent.  The results of significant diagnostics from this hospitalization (including imaging, microbiology, ancillary and laboratory) are listed below for reference.     Microbiology: Recent Results (from the past 240 hour(s))  Culture, blood (Routine x 2)     Status: None (Preliminary result)   Collection Time: 10/01/2019 11:42 AM   Specimen: BLOOD RIGHT FOREARM  Result Value Ref Range Status   Specimen Description   Final    BLOOD RIGHT FOREARM Performed at [facility] [address] [place], [place] [address]    Special Requests   Final    BOTTLES DRAWN AEROBIC AND ANAEROBIC Blood Culture results may not be optimal due to an inadequate volume of blood received in culture bottles Performed at [facility] [address], [place], [place] [address]    Culture   Final    NO GROWTH 2 DAYS Performed at [facility] [address], [place], [place] [address]    Report Status PENDING  Incomplete  Culture, blood (Routine x 2)     Status: None (Preliminary result)   Collection Time: 10/13/2019 12:22 PM   Specimen: BLOOD  Result Value Ref Range Status   Specimen Description   Final    BLOOD RIGHT WRIST Performed at  [facility]  [facility], [place] [address] [place], [place] [address]    Special Requests   Final    BOTTLES DRAWN AEROBIC AND ANAEROBIC Blood Culture adequate volume Performed at [facility] [address], [place], [place] [address]    Culture   Final    NO GROWTH 2 DAYS Performed at [facility] [address], [place], [place] [address]    Report Status PENDING  Incomplete  SARS CORONAVIRUS 2 (TAT 6-24 HRS) Nasopharyngeal Nasopharyngeal Swab     Status: None   Collection Time: 10/14/2019  1:53 PM   Specimen: Nasopharyngeal Swab  Result Value Ref Range Status   SARS Coronavirus 2 NEGATIVE NEGATIVE Final    Comment: (NOTE) SARS-CoV-2 target nucleic acids are NOT DETECTED. The SARS-CoV-2 RNA is generally detectable in upper and lower respiratory specimens during the acute phase of infection. Negative results do not preclude SARS-CoV-2 infection, do not rule out co-infections with other pathogens, and should not be used as the sole basis for treatment or other patient management decisions. Negative results must be combined with clinical observations, patient history, and epidemiological information. The expected result is Negative. Fact Sheet for Patients: SugarRoll.be Fact Sheet for Healthcare Providers: https://www.woods-mathews.com/ This test is not yet approved or cleared by the [place] FDA and  has been authorized for detection and/or diagnosis of SARS-CoV-2 by FDA under an Emergency Use Authorization (EUA). This EUA will remain  in effect (meaning this test can be used) for the duration of the COVID-19 declaration under Section 56 4(b)(1) of the Act, 21 U.S.C. section 360bbb-3(b)(1), unless the authorization is terminated or revoked sooner. Performed at [facility], [place] [address] [place], [place] [address]      Labs: BNP (last 3 results) No results for input(s): BNP in the last  8760 hours. Basic Metabolic Panel: Recent Labs  Lab 09/23/2019 1142 10/03/19 0026 10/03/19 1659  NA 136 140  --   K 5.2* 5.4* 5.0  CL 99 101  --   CO2 29 32  --   GLUCOSE 106* 113*  --   BUN 43* 44*  --   CREATININE 1.20* 1.11*  --   CALCIUM 7.8* 7.9*  --    Liver Function Tests: Recent Labs  Lab 09/22/2019 1142  AST 15  ALT 11  ALKPHOS 96  BILITOT 1.8*  PROT 6.5  ALBUMIN 2.9*   No results for input(s): LIPASE, AMYLASE in the last 168 hours. Recent Labs  Lab 10/03/19 0026  AMMONIA 32   CBC: Recent Labs  Lab 09/29/2019 1142 10/03/19 0026  WBC 5.5 5.6  NEUTROABS 4.5  --   HGB 12.3 11.7*  HCT 42.3 40.1  MCV 118.2* 120.1*  PLT 174 160   Cardiac Enzymes: No results for input(s): CKTOTAL, CKMB, CKMBINDEX, TROPONINI in the last 168 hours. BNP: Invalid input(s): POCBNP CBG: Recent Labs  Lab 10/03/19 0821 10/03/19 1216 10/03/19 1702  GLUCAP 91 86 77   D-Dimer No results for input(s): DDIMER in the last 72 hours. Hgb A1c Recent Labs    10/03/19 0026  HGBA1C 4.4*   Lipid Profile No results for input(s): CHOL, HDL, LDLCALC, TRIG, CHOLHDL, LDLDIRECT in the last 72 hours. Thyroid function studies Recent Labs    10/03/19 0026  TSH 3.788   Anemia work up Recent Labs    10/03/19 0026  VITAMINB12 301  FOLATE 12.5  FERRITIN 32  TIBC 208*  IRON 60  RETICCTPCT 6.3*   Urinalysis  Component Value Date/Time   COLORURINE AMBER (A) 10/14/2019 1309   APPEARANCEUR HAZY (A) 10/10/2019 1309   LABSPEC 1.018 10/10/2019 1309   PHURINE 5.0 10/13/2019 1309   GLUCOSEU NEGATIVE 10/15/2019 1309   HGBUR NEGATIVE 10/09/2019 1309   BILIRUBINUR SMALL (A) 09/21/2019 1309   KETONESUR NEGATIVE 09/25/2019 1309   PROTEINUR 30 (A) 10/11/2019 1309   NITRITE POSITIVE (A) 09/27/2019 1309   LEUKOCYTESUR TRACE (A) 10/17/2019 1309   Sepsis Labs Invalid input(s): PROCALCITONIN,  WBC,  LACTICIDVEN   Time coordinating discharge: 35 minutes  SIGNED:  Mercy Riding,  MD  [facility] 11/03/2019, 3:52 PM  If 7PM-7AM, please contact night-coverage [email or web address] Password [email or web address]

## 2019-10-20 NOTE — TOC Progression Note (Signed)
Transition of Care [facility]) - Progression Note    Patient Details  Name: Gabriela WADDEL MRN: [id number] Date of Birth: 24-Jun-1941  [facility]) CM/SW Contact  Joaquin Courts, RN Phone Number: Oct 12, 2019, 9:51 AM  Clinical Narrative:    CM called son and daughter in law to offer hospice choice, unable to reach either person. Unable to leave voicemail. Will continue to attempt to reach family.     Expected Discharge Plan: Hospice Medical Facility Barriers to Discharge: Other (comment)(unable to reach family to offer choice)  Expected Discharge Plan and Services Expected Discharge Plan: [place]   Discharge Planning Services: CM Consult     Expected Discharge Date: Oct 12, 2019               DME Arranged: N/A DME Agency: NA       HH Arranged: NA HH Agency: NA         Social Determinants of Health (SDOH) Interventions    Readmission Risk Interventions No flowsheet data found.

## 2019-10-20 NOTE — Death Summary Note (Signed)
Physician Discharge Summary  Gabriela Davis [id number] DOB: May 20, 1941 DOA: 19-Oct-2019  PCP: Denita Lung, MD  Admit date: 10/19/2019 Death date: 21-Oct-2019  Hospital Course: 78 y.o.femalewith history ofpossible dementia, recent diagnosis of right breast cancer, COPD not on treatment oxygen,NICM not on medication, CVA without deficits, HTN and tobacco use presenting with encephalopathy and hypoxic respiratory failure and admitted for acute metabolic encephalopathy, acute respiratory failure due to multifocal pneumonia and possible UTI.  Found hypoxic to 50% on room air when EMS arrived.  In ED, HDS. Descalated from NRB to 4 L by [place] and saturating in upper 90s. MCV 118. K5.2. Creatinine 1.20. BUN 43. Albumin 2.9. Total bili 1.8. Otherwise, CBC and CMP not impressive.  LFT normal.  PT/INR normal. UA concerning for UTI. EKG sinus rhythm with frequent PACs but no acute ischemic finding. CXR concerning for multifocal pneumonia. CTA chest negative for central PE butrevealed right breast mass with bilateral axillary LAD, edema and skin thickening of left breast, bilateral GG airspace opacities, 1.2 cm LLL nodule, moderate loculated left-sided pleural effusion, debris's in left main bronchus and interlobular septal thickening.Cultures obtained. Patient was started on Levaquin and vancomycin due to cephalosporin allergies.Hospitalist service called for encephalopathy and multifocal pneumonia.  The next day, patient continued to be somnolent.  She also had left-sided hemiparesis.  MRI brain obtained and revealed large acute right MCA CVA with additional scattered bilateral PCA and cerebellar infarcts.  Family discussion held and scope of treatment and goal of care discussed with patient's son, [name] and daughter-in-law in light of patient's advanced breast cancer, significant CVA, possible lung cancer, encephalopathy and multifocal pneumonia.  Family felt full scope treatment at  this time would be more harm than benefit to patient, and comfort measures would be appropriate.  Comfort measures initiated.   Patient passed away on 10/21/19 at 4:11 PM.  Updated patient's family, son and daughter-in-law.   Discharge Diagnoses:  Acute respiratory failure with hypoxia due to multifocal pneumonia:Hypoxic to 50% EMS. CXR and CT chest revealed multifocal pneumonia.Also concern about aspiration given somedebris's and left main bronchus.  COVID-19 negative.Allergic to cephalosporins. Vancomycin and Levaquin in ED. Levaquin and IV Flagyl 11/13>> 11/4. Comfort measures initiated on 10/20/19.  Passed away on 10-21-19  Left hemiparesis/acute metabolic encephalopathy/right MCA CVA/Scattered bilateral PCA CVA/Cerebellar CVA-noted on MRI  Advanced right breast cancer Bilateral axillary nodules Left lower lobe lung nodules:  AKI/azotemia/mild hyperkalemia: Baseline creatinine 0.6-0.7  Moderate loculated left-sided pleural effusion: could be from pneumonia or malignancy.  Chronic COPD:Not on oxygen treatment at home  NICMnot on medication:No signs of fluid overload  Pyuria: could have UTI  Tobacco use disorder:reportedly smokes about 1.5 pack a day  Mild hyperkalemia: Resolved  Skin rash: differentials include dermatitis, intertrigoorcutaneous manifestation of breast cancer  Anemia of chronic disease-anemia panel consistent with this.  H&H stable.   Essential hypertension: Normotensive  Deconditioning/debility/goal of care:  Discharge Instructions   Allergies as of 2019/10/21      Reactions   Cephalexin Anaphylaxis   Morphine And Related Nausea And Vomiting   Other Other (See Comments)   Adhesive tape - causes blisters    Penicillins Hives, Swelling   SEVERE Has patient had a PCN reaction causing immediate rash, facial/tongue/throat swelling, SOB or lightheadedness with hypotension: Yes swelling and hives.  Has patient had a PCN reaction  that required hospitalization Yes Has patient had a PCN reaction occurring within the last 10 years:No If all of the above answers are "NO", then may proceed  with Cephalosporin use.        Consultations:  Neurology  Palliative care  Procedures/Studies:  2D Echo: None  Ct Head Wo Contrast  Result Date: 10/03/2019 CLINICAL DATA:  Encephalopathy EXAM: CT HEAD WITHOUT CONTRAST TECHNIQUE: Contiguous axial images were obtained from the base of the skull through the vertex without intravenous contrast. COMPARISON:  08/11/2017 FINDINGS: Brain: There is low-density throughout the right frontal lobe, sylvian fissure and temporal lobe compatible with acute infarction. No hemorrhage or hydrocephalus. There is atrophy and chronic small vessel disease changes. Vascular: No hyperdense vessel or unexpected calcification. Skull: No acute calvarial abnormality. Sinuses/Orbits: Visualized paranasal sinuses and mastoids clear. Orbital soft tissues unremarkable. Other: None IMPRESSION: Area of low-density in the right frontal, temporal lobes and sylvian fissure concerning for acute MCA infarct. No hemorrhage. Atrophy, chronic small vessel disease. Electronically Signed   By: Rolm Baptise M.D.   On: 10/03/2019 19:37   Ct Angio Chest Pe W And/or Wo Contrast  Result Date: 10/05/2019 CLINICAL DATA:  PE suspected. Shortness of breath. EXAM: CT ANGIOGRAPHY CHEST WITH CONTRAST TECHNIQUE: Multidetector CT imaging of the chest was performed using the standard protocol during bolus administration of intravenous contrast. Multiplanar CT image reconstructions and MIPs were obtained to evaluate the vascular anatomy. CONTRAST:  46mL OMNIPAQUE IOHEXOL 350 MG/ML SOLN COMPARISON:  None. FINDINGS: Cardiovascular: Evaluation for pulmonary emboli is limited by respiratory motion artifact. Given this limitation, no acute PE was identified.The main pulmonary artery is not significantly dilated. The heart size is normal. Aortic  calcifications are noted. Coronary artery calcifications are noted. Mediastinum/Nodes: --there prominent but subcentimeter mediastinal and hilar lymph nodes. --there are enlarged bilateral axillary lymph nodes. For example there is a 2.1 by 1.3 cm left axillary lymph node. --No supraclavicular lymphadenopathy. --Normal thyroid gland. --the esophagus is fluid-filled to the level of the upper thorax. Lungs/Pleura: Evaluation of the lung fields is limited by respiratory motion artifact. There are ground-glass airspace opacities throughout the right upper lobe and right lower lobe. There are more focal areas of consolidation within the left upper lobe and left lower lobe. There is a rounded pulmonary opacity measuring approximately 1.2 cm in the left lower lobe (axial series 6, image 57). There is a somewhat loculated left-sided pleural effusion. There is significant left-sided volume loss resulting in shift of the mediastinum to the left. Debris is noted within the left mainstem bronchus. There is a trace right-sided pleural effusion. There is some smooth interlobular septal thickening in the right upper lobe. Upper Abdomen: No acute abnormality. Musculoskeletal: There is a right breast mass measuring approximately 3.2 by 4.1 cm. The partially visualized left breast demonstrates edema and skin thickening. IMPRESSION: 1. Evaluation for pulmonary emboli is limited by respiratory motion artifact. Given this limitation, no acute PE was identified. 2. Right breast mass with bilateral axillary lymphadenopathy. Edema and skin thickening of the left breast of unknown clinical significance. Clinical correlation is recommended. 3. Bilateral ground-glass airspace opacities throughout the right upper lobe and right lower lobe, with more focal areas of consolidation within the left upper lobe and left lower lobe. These findings are nonspecific and could represent multifocal pneumonia or aspiration. Underlying metastatic disease is  difficult to exclude. 4. Rounded 1.2 cm nodule in the left lower lobe is concerning for metastatic disease. 5. Small to moderate-sized loculated left-sided pleural effusion. 6. Debris is noted within the left mainstem bronchus. 7. Trace right-sided pleural effusion. Interlobular septal thickening most notably in the right upper lobe may be secondary to  interstitial edema. Lymphangitic carcinomatosis is not excluded. 8. Coronary artery and aortic atherosclerosis. Aortic Atherosclerosis (ICD10-I70.0). Electronically Signed   By: Constance Holster M.D.   On: 09/20/2019 16:17   Mr Brain Wo Contrast  Result Date: 10/03/2019 CLINICAL DATA:  Initial evaluation for acute altered mental status, left-sided weakness. EXAM: MRI HEAD WITHOUT CONTRAST TECHNIQUE: Multiplanar, multiecho pulse sequences of the brain and surrounding structures were obtained without intravenous contrast. COMPARISON:  Prior MRI from 02/27/2018. FINDINGS: Brain: Diffuse prominence of the CSF containing spaces compatible with generalized age-related cerebral atrophy. Patchy and confluent T2/FLAIR hyperintensity within the periventricular in deep white matter both cerebral hemispheres most consistent with chronic small vessel ischemic disease, mild to moderate in nature. Large confluent area of restricted diffusion involving the right frontal lobe, including the right insula, right basal ganglia, and overlying supra ganglionic cortex, compatible with large acute right MCA territory infarct. Additional patchy restricted diffusion involving the right occipital lobe compatible with acute right PCA territory infarct. Patchy small volume foci of restricted diffusion involving the left parieto-occipital gray matter and underlying periventricular white matter consistent with acute left PCA territory infarct. Few small acute to early subacute bilateral cerebellar infarcts noted as well, right greater than left. No associated hemorrhage or significant mass  effect. No encephalomalacia to suggest chronic cortical infarction. Few small scattered foci of chronic hemosiderin staining noted within the right frontal and posterior temporal regions. No mass lesion, midline shift or mass effect. No hydrocephalus. No extra-axial fluid collection. Pituitary gland suprasellar region within normal limits. Midline structures intact. Vascular: Major intracranial vascular flow voids are maintained. Skull and upper cervical spine: Craniocervical junction within normal limits. Visualized upper cervical spine demonstrates no significant finding. Bone marrow signal intensity within normal limits. No scalp soft tissue abnormality. Sinuses/Orbits: Globes and orbital soft tissues within normal limits. Chronic frontoethmoidal and left maxillary sinusitis noted. Paranasal sinuses are otherwise largely clear. Left greater than right mastoid effusions noted. Small amount of layering fluid noted within the nasopharynx. Other: None. IMPRESSION: 1. Large acute ischemic right MCA territory infarct, with additional scattered bilateral PCA and cerebellar infarcts as above. No associated hemorrhage or significant mass effect. 2. Underlying age-related cerebral atrophy with mild to moderate chronic microvascular ischemic disease. 3. Chronic fronto ethmoidal and left maxillary sinusitis. Electronically Signed   By: Jeannine Boga M.D.   On: 10/03/2019 16:02   Dg Chest Portable 1 View  Result Date: 09/21/2019 CLINICAL DATA:  Hypoxia. Confusion and altered mental status. Patient had difficulty cooperating with the exam. EXAM: PORTABLE CHEST 1 VIEW COMPARISON:  Radiograph 12/08/2012 FINDINGS: Patient had difficulty with positioning, significant patient rotation. Cardiomegaly. Aortic atherosclerosis. Mediastinal contours are difficult to accurately define given patient rotation. Retrocardiac opacity likely combination of airspace disease and pleural fluid. Mild vascular congestion. Rounded  calcifications projecting over the mediastinum are likely calcified lymph nodes. No pneumothorax. IMPRESSION: 1. Retrocardiac opacity, likely combination of pleural effusion and atelectasis/pneumonia. 2. Cardiomegaly with vascular congestion. 3.  Aortic Atherosclerosis (ICD10-I70.0). 4. Rotated exam limits assessment. Electronically Signed   By: Keith Rake M.D.   On: 10/17/2019 13:59   Vas [place] Lower Extremity Venous (dvt)  Result Date: 10/03/2019  Lower Venous Study Indications: Edema.  Risk Factors: Cancer Breast cancer tobacco abuse. Comparison Study: Prior study from 12/08/12 is available for comparison. Performing Technologist: Sharion Dove RVS  Examination Guidelines: A complete evaluation includes B-mode imaging, spectral Doppler, color Doppler, and power Doppler as needed of all accessible portions of each vessel. Bilateral testing is considered  an integral part of a complete examination. Limited examinations for reoccurring indications may be performed as noted.  +---------+---------------+---------+-----------+----------+--------------+ RIGHT    CompressibilityPhasicitySpontaneityPropertiesThrombus Aging +---------+---------------+---------+-----------+----------+--------------+ CFV      Full                                         pulsatile      +---------+---------------+---------+-----------+----------+--------------+ SFJ      Full                                                        +---------+---------------+---------+-----------+----------+--------------+ FV Prox  Full                                                        +---------+---------------+---------+-----------+----------+--------------+ FV Mid   Full                                                        +---------+---------------+---------+-----------+----------+--------------+ FV DistalFull                                                         +---------+---------------+---------+-----------+----------+--------------+ PFV      Full                                                        +---------+---------------+---------+-----------+----------+--------------+ POP      Full                                         pulsatile      +---------+---------------+---------+-----------+----------+--------------+ PTV      Full                                                        +---------+---------------+---------+-----------+----------+--------------+ PERO     Full                                                        +---------+---------------+---------+-----------+----------+--------------+   +---------+---------------+---------+-----------+----------+--------------+ LEFT     CompressibilityPhasicitySpontaneityPropertiesThrombus Aging +---------+---------------+---------+-----------+----------+--------------+ CFV      Full  pulsatile      +---------+---------------+---------+-----------+----------+--------------+ SFJ      Full                                                        +---------+---------------+---------+-----------+----------+--------------+ FV Prox  Full                                                        +---------+---------------+---------+-----------+----------+--------------+ FV Mid   Full                                                        +---------+---------------+---------+-----------+----------+--------------+ FV DistalFull                                                        +---------+---------------+---------+-----------+----------+--------------+ PFV      Full                                                        +---------+---------------+---------+-----------+----------+--------------+ POP      Full                                         pulsatile       +---------+---------------+---------+-----------+----------+--------------+ PTV      Full                                                        +---------+---------------+---------+-----------+----------+--------------+ PERO     Full                                                        +---------+---------------+---------+-----------+----------+--------------+     Summary: Right: There is no evidence of deep vein thrombosis in the lower extremity. Left: There is no evidence of deep vein thrombosis in the lower extremity.  *See table(s) above for measurements and observations. Electronically signed by Monica Martinez MD on 10/03/2019 at 10:40:46 AM.    Final       Discharge Exam: Vitals:   10/03/19 2031 Oct 21, 2019 1352  BP:  (!) 100/48  Pulse:  (!) 43  Resp:  12  Temp:  99 F (37.2 C)  SpO2: (!) 85% (!) 86%    NEURO: Pupils dilated and not reactive.  No corneal  reflex. RESP: No lung sounds. CVS: No audible heart sounds.  The results of significant diagnostics from this hospitalization (including imaging, microbiology, ancillary and laboratory) are listed below for reference.     Microbiology: Recent Results (from the past 240 hour(s))  Culture, blood (Routine x 2)     Status: None (Preliminary result)   Collection Time: 09/20/2019 11:42 AM   Specimen: BLOOD RIGHT FOREARM  Result Value Ref Range Status   Specimen Description   Final    BLOOD RIGHT FOREARM Performed at [facility] [address] [place], [place] [address]    Special Requests   Final    BOTTLES DRAWN AEROBIC AND ANAEROBIC Blood Culture results may not be optimal due to an inadequate volume of blood received in culture bottles Performed at [facility] [address], [place], [place] [address]    Culture   Final    NO GROWTH 2 DAYS Performed at [facility] [address], [place], [place] [address]    Report Status PENDING  Incomplete  Culture,  blood (Routine x 2)     Status: None (Preliminary result)   Collection Time: 09/24/2019 12:22 PM   Specimen: BLOOD  Result Value Ref Range Status   Specimen Description   Final    BLOOD RIGHT WRIST Performed at [facility] [address], [place], [place] [address]    Special Requests   Final    BOTTLES DRAWN AEROBIC AND ANAEROBIC Blood Culture adequate volume Performed at [facility] [address] [place], [place] [address]    Culture   Final    NO GROWTH 2 DAYS Performed at [facility] [address], [place], [place] [address]    Report Status PENDING  Incomplete  SARS CORONAVIRUS 2 (TAT 6-24 HRS) Nasopharyngeal Nasopharyngeal Swab     Status: None   Collection Time: 09/23/2019  1:53 PM   Specimen: Nasopharyngeal Swab  Result Value Ref Range Status   SARS Coronavirus 2 NEGATIVE NEGATIVE Final    Comment: (NOTE) SARS-CoV-2 target nucleic acids are NOT DETECTED. The SARS-CoV-2 RNA is generally detectable in upper and lower respiratory specimens during the acute phase of infection. Negative results do not preclude SARS-CoV-2 infection, do not rule out co-infections with other pathogens, and should not be used as the sole basis for treatment or other patient management decisions. Negative results must be combined with clinical observations, patient history, and epidemiological information. The expected result is Negative. Fact Sheet for Patients: SugarRoll.be Fact Sheet for Healthcare Providers: https://www.woods-mathews.com/ This test is not yet approved or cleared by the [place] FDA and  has been authorized for detection and/or diagnosis of SARS-CoV-2 by FDA under an Emergency Use Authorization (EUA). This EUA will remain  in effect (meaning this test can be used) for the duration of the COVID-19 declaration under Section 56 4(b)(1) of the Act, 21 U.S.C. section 360bbb-3(b)(1),  unless the authorization is terminated or revoked sooner. Performed at [facility], [place] [address], [place], [place] [address]      Labs: BNP (last 3 results) No results for input(s): BNP in the last 8760 hours. Basic Metabolic Panel: Recent Labs  Lab 10/14/2019 1142 10/03/19 0026 10/03/19 1659  NA 136 140  --   K 5.2* 5.4* 5.0  CL 99 101  --   CO2 29 32  --   GLUCOSE 106* 113*  --   BUN 43* 44*  --  CREATININE 1.20* 1.11*  --   CALCIUM 7.8* 7.9*  --    Liver Function Tests: Recent Labs  Lab 10/05/2019 1142  AST 15  ALT 11  ALKPHOS 96  BILITOT 1.8*  PROT 6.5  ALBUMIN 2.9*   No results for input(s): LIPASE, AMYLASE in the last 168 hours. Recent Labs  Lab 10/03/19 0026  AMMONIA 32   CBC: Recent Labs  Lab 10/06/2019 1142 10/03/19 0026  WBC 5.5 5.6  NEUTROABS 4.5  --   HGB 12.3 11.7*  HCT 42.3 40.1  MCV 118.2* 120.1*  PLT 174 160   Cardiac Enzymes: No results for input(s): CKTOTAL, CKMB, CKMBINDEX, TROPONINI in the last 168 hours. BNP: Invalid input(s): POCBNP CBG: Recent Labs  Lab 10/03/19 0821 10/03/19 1216 10/03/19 1702  GLUCAP 91 86 77   D-Dimer No results for input(s): DDIMER in the last 72 hours. Hgb A1c Recent Labs    10/03/19 0026  HGBA1C 4.4*   Lipid Profile No results for input(s): CHOL, HDL, LDLCALC, TRIG, CHOLHDL, LDLDIRECT in the last 72 hours. Thyroid function studies Recent Labs    10/03/19 0026  TSH 3.788   Anemia work up Recent Labs    10/03/19 0026  VITAMINB12 301  FOLATE 12.5  FERRITIN 32  TIBC 208*  IRON 60  RETICCTPCT 6.3*   Urinalysis    Component Value Date/Time   COLORURINE AMBER (A) 10/06/2019 1309   APPEARANCEUR HAZY (A) 10/01/2019 1309   LABSPEC 1.018 09/27/2019 1309   PHURINE 5.0 09/26/2019 1309   GLUCOSEU NEGATIVE 09/30/2019 1309   HGBUR NEGATIVE 09/30/2019 1309   BILIRUBINUR SMALL (A) 09/24/2019 1309   KETONESUR NEGATIVE 10/03/2019 1309   PROTEINUR 30 (A) 09/24/2019 1309   NITRITE  POSITIVE (A) 10/17/2019 1309   LEUKOCYTESUR TRACE (A) 10/19/2019 1309   Sepsis Labs Invalid input(s): PROCALCITONIN,  WBC,  LACTICIDVEN   Time coordinating discharge: 35 minutes  SIGNED:  Mercy Riding, MD  [facility] 2019-10-25, 5:07 PM  If 7PM-7AM, please contact night-coverage [email or web address] Password [email or web address]

## 2019-10-20 NOTE — Consult Note (Signed)
Consultation Note Date: 06-Oct-2019   Patient Name: Gabriela Davis  DOB: 1941/02/01  MRN: [id number]  Age / Sex: 78 y.o., female  PCP: Denita Lung, MD Referring Physician: Mercy Riding, MD  Reason for Consultation: Terminal Care  HPI/Patient Profile: 78 y.o. female    admitted on 09/22/2019     Clinical Assessment and Goals of Care: 78 year old lady now with life limiting illnesses of recently diagnosed advanced breast cancer, possible lung cancer, was initially admitted for multifocal pneumonia.  Hospital course also complicated by MRI brain findings of large acute ischemic right MCA cerebrovascular accident with additional scattered bilateral PCA and cerebellar infarcts.  It is noted that the patient has had ongoing functional decline essentially accelerated since the diagnosis of her breast cancer.  Family has elected for full scope of comfort measures.  Palliative consult for ongoing terminal care.  Patient is a weak appearing elderly lady resting in bed.  She appears sleepy.  She does not appear to display any nonverbal gestures of distress or discomfort.  Does not wake up, does not follow commands.  Appears to be comfortable presently.  No family present at bedside.  Call placed unable to reach son [name].  Chart reviewed in detail.  Briefly discussed with TRH MD as well.  Additional recommendations as listed below.  Thank you for the consult.  NEXT OF KIN  son [name].   SUMMARY OF RECOMMENDATIONS    Agree with DNR DNI Agree with comfort measures Agree with current PRNs listed under end of life/comfort care order set.  Prognosis appears few days to less than 2 weeks at this point, in my opinion.  Anticipated hospital death versus transfer to residential hospice, if bed available and if family also in agreement.  Comfort cart and chaplain consult if family arrives at bedside later today.  Thank you for the consult.   Code Status/Advance Care Planning:  DNR    Symptom Management:      Palliative Prophylaxis:   Delirium Protocol  Additional Recommendations (Limitations, Scope, Preferences):  Full Comfort Care  Psycho-social/Spiritual:   Desire for further Chaplaincy support:yes  Additional Recommendations: Caregiving  Support/Resources  Prognosis:   Hours - Days  Discharge Planning: Anticipated Hospital Death vs. Transfer to residential hospice if appropriate.       Primary Diagnoses: Present on Admission: . Acute metabolic encephalopathy   I have reviewed the medical record, interviewed the patient and family, and examined the patient. The following aspects are pertinent.  Past Medical History:  Diagnosis Date  . Arthritis   . Cardiomyopathy ([place])   . COPD (chronic obstructive pulmonary disease) ([place])   . History of CVA (cerebrovascular accident)    No residual  . Hypertension   . Stroke ([place])    slight stroke in 2010 - no deficits noted  . Tobacco use disorder    Social History   Socioeconomic History  . Marital status: Single    Spouse name: Not on file  . Number of children: 2  . Years of  education: Not on file  . Highest education level: Not on file  Occupational History  . Not on file  Social Needs  . Financial resource strain: Not on file  . Food insecurity    Worry: Not on file    Inability: Not on file  . Transportation needs    Medical: No    Non-medical: No  Tobacco Use  . Smoking status: Current Every Day Smoker    Packs/day: 0.50    Years: 43.00    Pack years: 21.50    Types: Cigarettes  . Smokeless tobacco: Never Used  Substance and Sexual Activity  . Alcohol use: Yes    Alcohol/week: 0.0 standard drinks    Comment: rare  . Drug use: No  . Sexual activity: Not Currently    Birth control/protection: Post-menopausal  Lifestyle  . Physical activity    Days per week: Not on file    Minutes per session:  Not on file  . Stress: Not on file  Relationships  . Social Herbalist on phone: Not on file    Gets together: Not on file    Attends religious service: Not on file    Active member of club or organization: Not on file    Attends meetings of clubs or organizations: Not on file    Relationship status: Not on file  Other Topics Concern  . Not on file  Social History Narrative   Lives with boyfriend.  Widow.    Family History  Problem Relation Age of Onset  . CAD Father 59   Scheduled Meds: Continuous Infusions: PRN Meds:.acetaminophen **OR** acetaminophen, albuterol, bisacodyl, diphenhydrAMINE, glycopyrrolate **OR** glycopyrrolate **OR** glycopyrrolate, haloperidol **OR** haloperidol **OR** haloperidol lactate, LORazepam **OR** LORazepam **OR** LORazepam, morphine injection, ondansetron **OR** ondansetron (ZOFRAN) IV, polyvinyl alcohol Medications Prior to Admission:  Prior to Admission medications   Medication Sig Start Date End Date Taking? Authorizing Provider  anastrozole (ARIMIDEX) 1 MG tablet Take 1 tablet (1 mg total) by mouth daily. 09/23/19  Yes Nicholas Lose, MD  albuterol (PROVENTIL) (2.5 MG/3ML) 0.083% nebulizer solution Take 3 mLs (2.5 mg total) by nebulization every 2 (two) hours as needed for wheezing. 2019/10/08   Mercy Riding, MD  bisacodyl (DULCOLAX) 10 MG suppository Place 1 suppository (10 mg total) rectally daily as needed for moderate constipation. October 08, 2019   Mercy Riding, MD  glycopyrrolate (ROBINUL) 0.2 MG/ML injection Inject 1 mL (0.2 mg total) into the vein every 4 (four) hours as needed (excessive secretions). 10/08/2019   Mercy Riding, MD  haloperidol lactate (HALDOL) 5 MG/ML injection Inject 0.1 mLs (0.5 mg total) into the vein every 4 (four) hours as needed (or delirium). 08-Oct-2019   Mercy Riding, MD  LORazepam (ATIVAN) 2 MG/ML injection Inject 0.5 mLs (1 mg total) into the vein every 4 (four) hours as needed for anxiety. October 08, 2019   Mercy Riding, MD   morphine 2 MG/ML injection Inject 0.5 mLs (1 mg total) into the vein every 2 (two) hours as needed (or dyspnea). 10/08/19   Mercy Riding, MD  ondansetron (ZOFRAN-ODT) 4 MG disintegrating tablet Take 1 tablet (4 mg total) by mouth every 6 (six) hours as needed for nausea. 10-08-19   Mercy Riding, MD  polyvinyl alcohol (LIQUIFILM TEARS) 1.4 % ophthalmic solution Place 1 drop into both eyes 4 (four) times daily as needed for dry eyes. Oct 08, 2019   Mercy Riding, MD   Allergies  Allergen Reactions  . Cephalexin Anaphylaxis  .  Morphine And Related Nausea And Vomiting  . Other Other (See Comments)    Adhesive tape - causes blisters   . Penicillins Hives and Swelling    SEVERE Has patient had a PCN reaction causing immediate rash, facial/tongue/throat swelling, SOB or lightheadedness with hypotension: Yes swelling and hives.  Has patient had a PCN reaction causing severe rash involving mucus membranes or skin necrosis: No Has patient had a PCN reaction that required hospitalization Yes Has patient had a PCN reaction occurring within the last 10 years:No If all of the above answers are "NO", then may proceed with Cephalosporin use.    Review of Systems Non verbal  Physical Exam Weak appearing elderly lady resting in bed Does not open eyes does not follow commands Shallow respirations S1-S2 Extremities cool to touch Abdomen not distended  Vital Signs: BP (!) 103/56 (BP Location: Right Arm)   Pulse 63   Temp 98.2 F (36.8 C) (Oral)   Resp 18   Ht 5\' 6"  (1.676 m)   Wt 83.2 kg   SpO2 (!) 85% Comment: pt comfort care, not in distress at this time  BMI 29.61 kg/m  Pain Scale: 0-10   Pain Score: 0-No pain   SpO2: SpO2: (!) 85 %(pt comfort care, not in distress at this time) O2 Device:SpO2: (!) 85 %(pt comfort care, not in distress at this time) O2 Flow Rate: .O2 Flow Rate (L/min): 5 L/min  IO: Intake/output summary:   Intake/Output Summary (Last 24 hours) at 10-27-19 1014 Last  data filed at 2019-10-27 0649 Gross per 24 hour  Intake 0 ml  Output 500 ml  Net -500 ml    LBM: Last BM Date: (UTA) Baseline Weight: Weight: 83.2 kg Most recent weight: Weight: 83.2 kg     Palliative Assessment/Data:   PPS 20%  Time In:  9.20 Time Out:  10.20 Time Total:  60  Greater than 50%  of this time was spent counseling and coordinating care related to the above assessment and plan.  Signed by: Loistine Chance, MD   Please contact Palliative Medicine Team phone at [phone] for questions and concerns.  For individual provider: See Shea Evans

## 2019-10-20 DEATH — deceased

## 2019-12-15 ENCOUNTER — Other Ambulatory Visit: Payer: Self-pay | Admitting: Hematology and Oncology
